# Patient Record
Sex: Male | Born: 1980 | Race: White | Hispanic: No | Smoking: Current every day smoker
Health system: Southern US, Community
[De-identification: ages and names within clinical notes are randomized; demographics above are authoritative.]

## PROBLEM LIST (undated history)

## (undated) DIAGNOSIS — I10 Essential (primary) hypertension: Secondary | ICD-10-CM

## (undated) DIAGNOSIS — H8109 Meniere's disease, unspecified ear: Secondary | ICD-10-CM

## (undated) HISTORY — PX: NO PAST SURGERIES: SHX2092

---

## 2007-12-18 ENCOUNTER — Emergency Department: Payer: Self-pay | Admitting: Emergency Medicine

## 2011-01-09 DIAGNOSIS — G459 Transient cerebral ischemic attack, unspecified: Secondary | ICD-10-CM | POA: Diagnosis present

## 2011-01-09 DIAGNOSIS — I10 Essential (primary) hypertension: Secondary | ICD-10-CM | POA: Diagnosis present

## 2014-06-21 ENCOUNTER — Emergency Department: Payer: Self-pay | Admitting: Emergency Medicine

## 2014-06-21 LAB — CBC WITH DIFFERENTIAL/PLATELET
BASOS PCT: 0.4 %
Basophil #: 0.1 10*3/uL (ref 0.0–0.1)
EOS ABS: 0.1 10*3/uL (ref 0.0–0.7)
Eosinophil %: 0.5 %
HCT: 47.5 % (ref 40.0–52.0)
HGB: 15.6 g/dL (ref 13.0–18.0)
LYMPHS PCT: 9 %
Lymphocyte #: 1.2 10*3/uL (ref 1.0–3.6)
MCH: 29 pg (ref 26.0–34.0)
MCHC: 32.9 g/dL (ref 32.0–36.0)
MCV: 88 fL (ref 80–100)
MONO ABS: 0.4 x10 3/mm (ref 0.2–1.0)
MONOS PCT: 2.7 %
NEUTROS ABS: 12 10*3/uL — AB (ref 1.4–6.5)
Neutrophil %: 87.4 %
Platelet: 208 10*3/uL (ref 150–440)
RBC: 5.39 10*6/uL (ref 4.40–5.90)
RDW: 14.3 % (ref 11.5–14.5)
WBC: 13.8 10*3/uL — AB (ref 3.8–10.6)

## 2014-06-21 LAB — BASIC METABOLIC PANEL
ANION GAP: 9 (ref 7–16)
BUN: 11 mg/dL (ref 7–18)
CHLORIDE: 105 mmol/L (ref 98–107)
Calcium, Total: 8.9 mg/dL (ref 8.5–10.1)
Co2: 27 mmol/L (ref 21–32)
Creatinine: 0.86 mg/dL (ref 0.60–1.30)
EGFR (African American): >60
EGFR (Non-African Amer.): >60
GLUCOSE: 127 mg/dL — AB (ref 65–99)
Osmolality: 282 (ref 275–301)
Potassium: 4.2 mmol/L (ref 3.5–5.1)
SODIUM: 141 mmol/L (ref 136–145)

## 2014-06-21 LAB — TROPONIN I: Troponin-I: <0.02 ng/mL

## 2015-01-16 IMAGING — CT CT HEAD WITHOUT CONTRAST
1 series · 16 of 30 positions shown, 20 images · non-contrast
Comparison: None.

CLINICAL DATA: Several weeks of dizziness with episode of vomiting
today.

EXAM:
CT HEAD WITHOUT CONTRAST
TECHNIQUE: Contiguous axial images were obtained from the base of the skull
through the vertex without intravenous contrast.

[Series 2: head wo · axial · 0.45mm/px · z∈[+382,+514]mm · 16 of 30 slices shown, 20 images]
[im 2/30  brain]
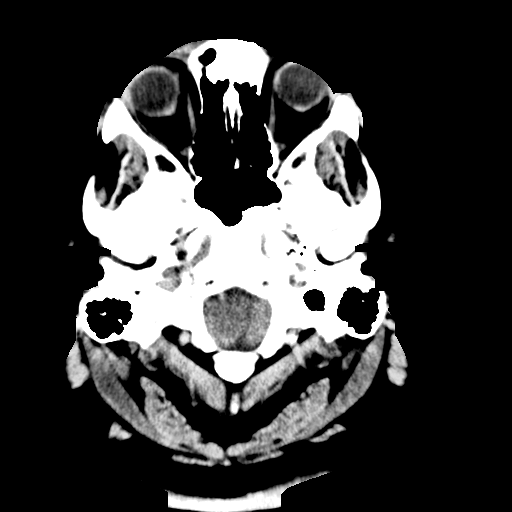
[im 2/30  bone]
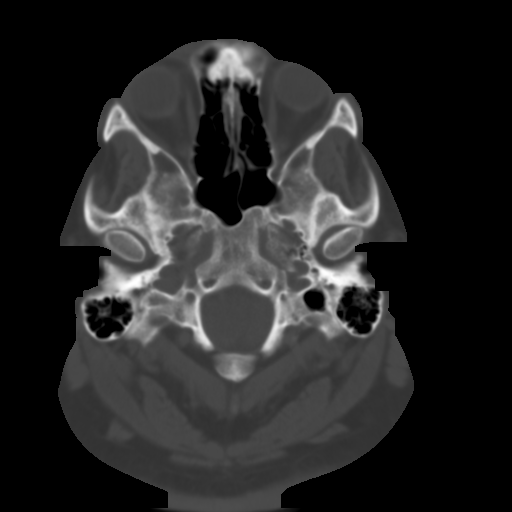
[im 4/30  brain]
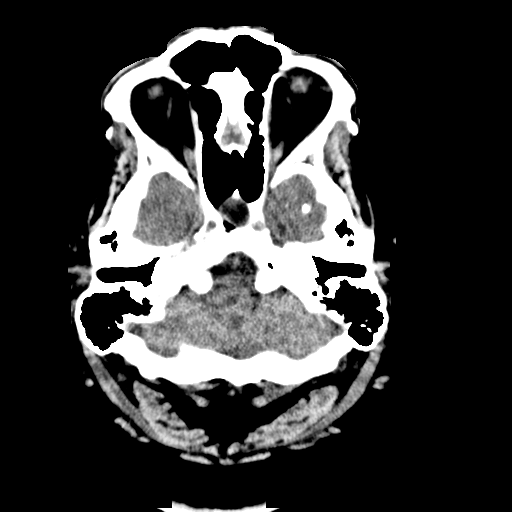
[im 6/30  brain]
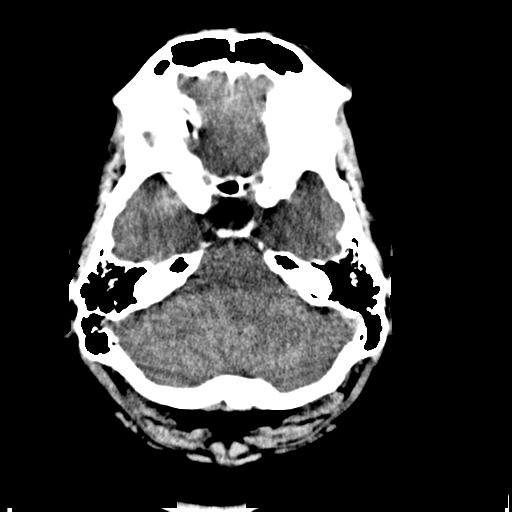
[im 8/30  brain]
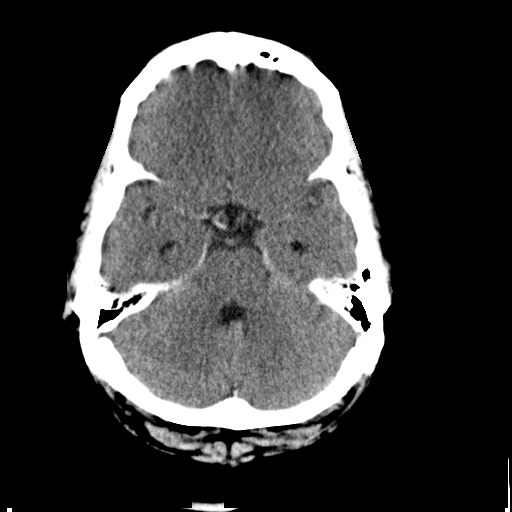
[im 9/30  brain]
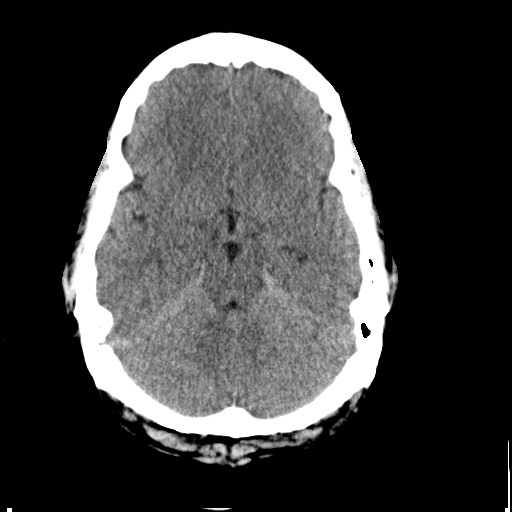
[im 9/30  bone]
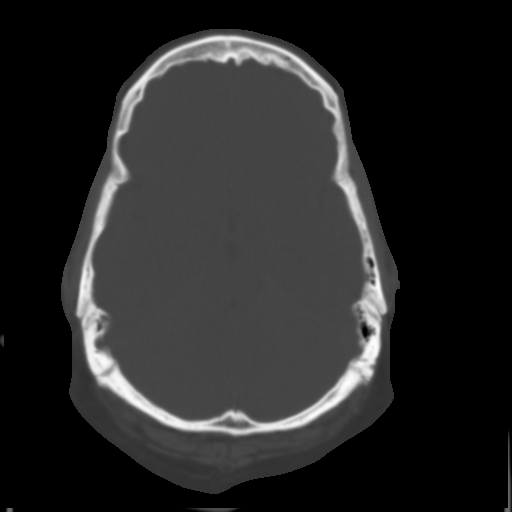
[im 11/30  brain]
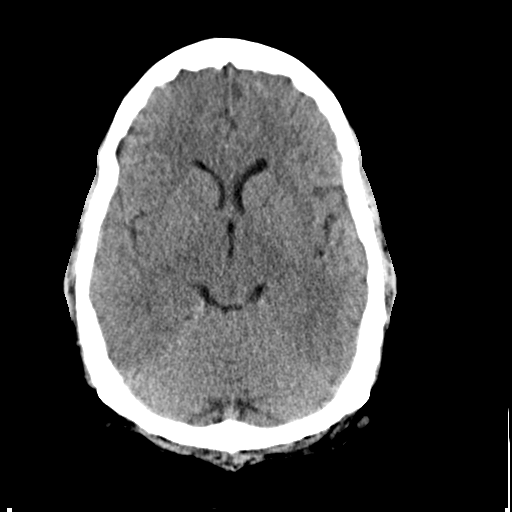
[im 13/30  brain]
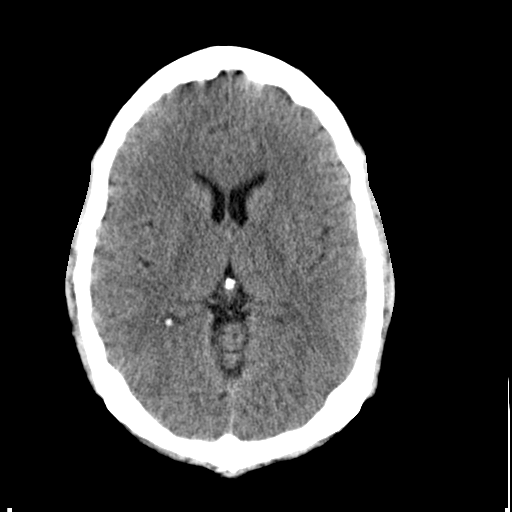
[im 15/30  brain]
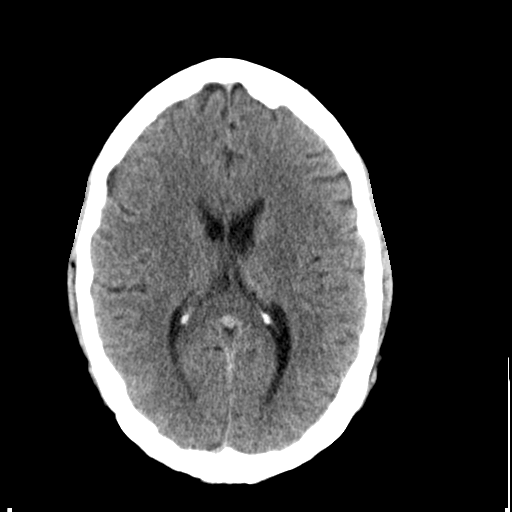
[im 16/30  brain]
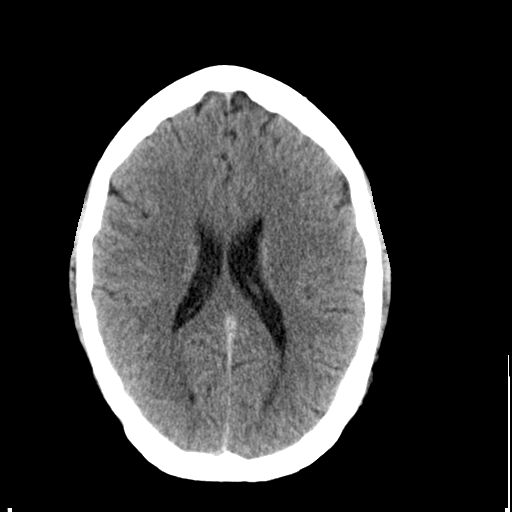
[im 16/30  bone]
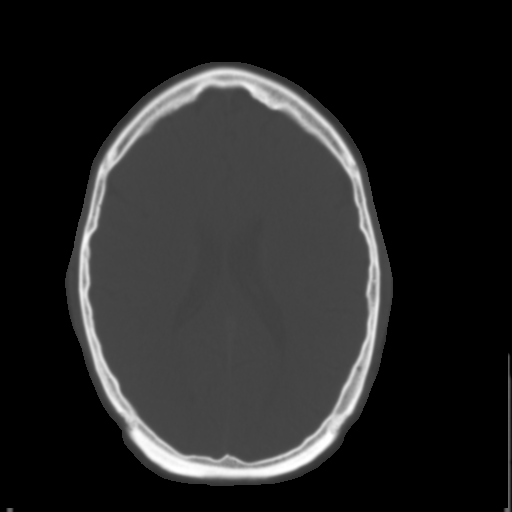
[im 18/30  brain]
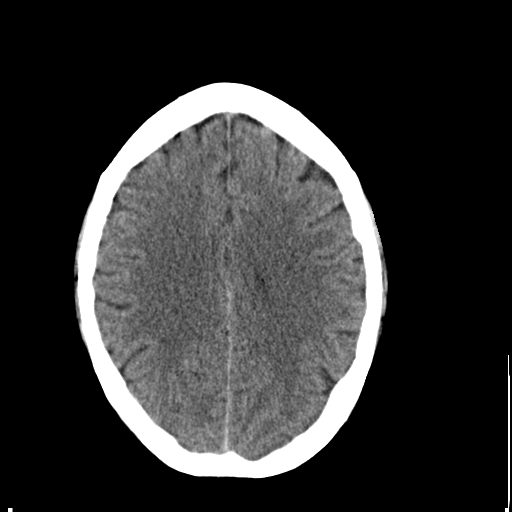
[im 20/30  brain]
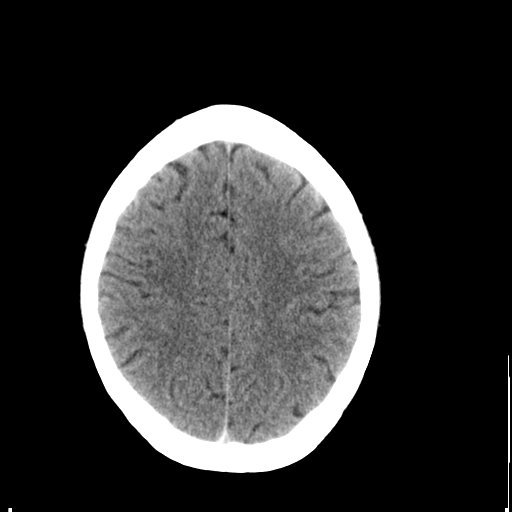
[im 22/30  brain]
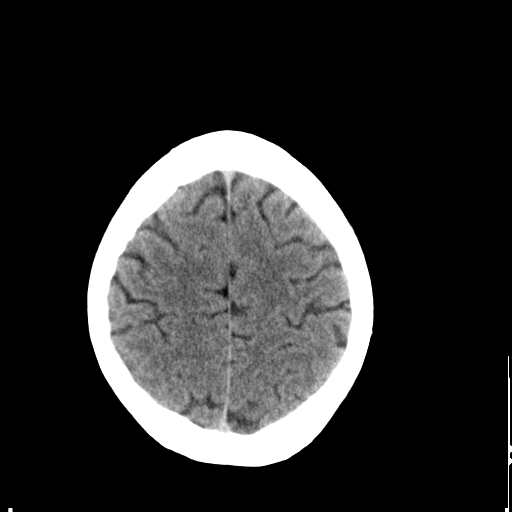
[im 23/30  brain]
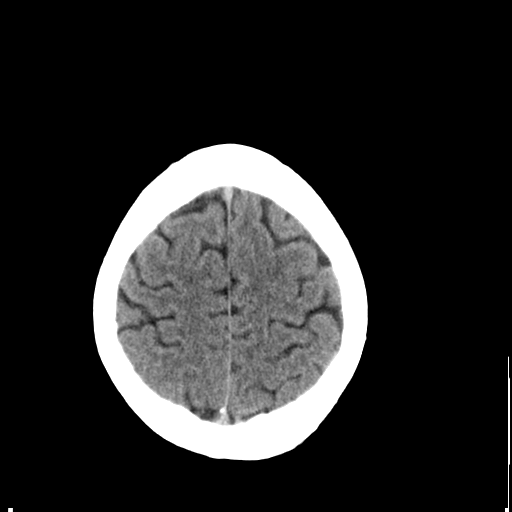
[im 23/30  bone]
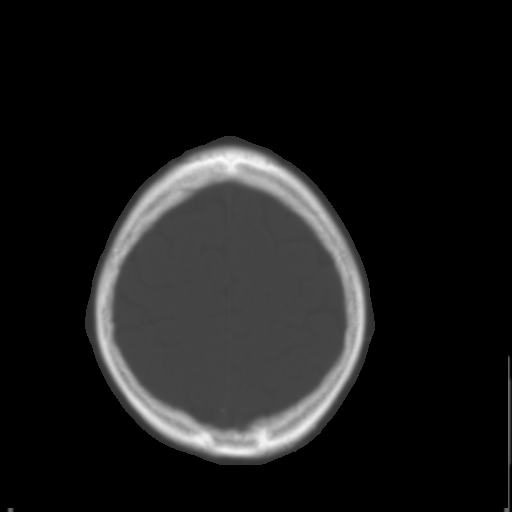
[im 25/30  brain]
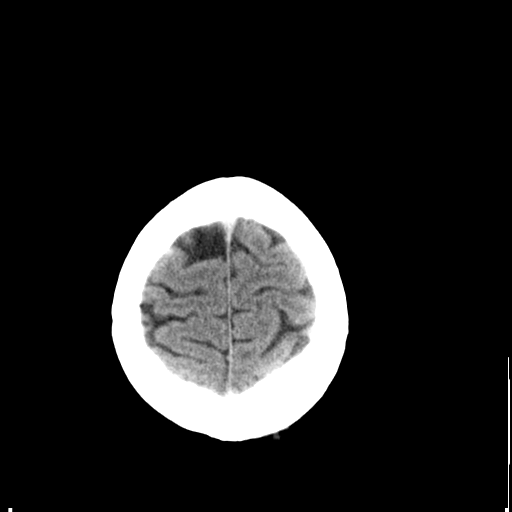
[im 27/30  brain]
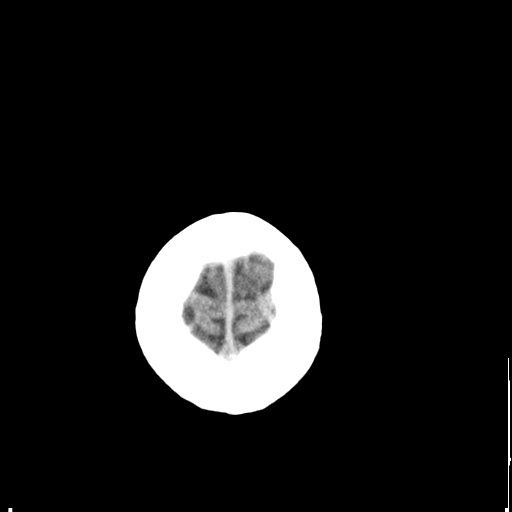
[im 29/30  brain]
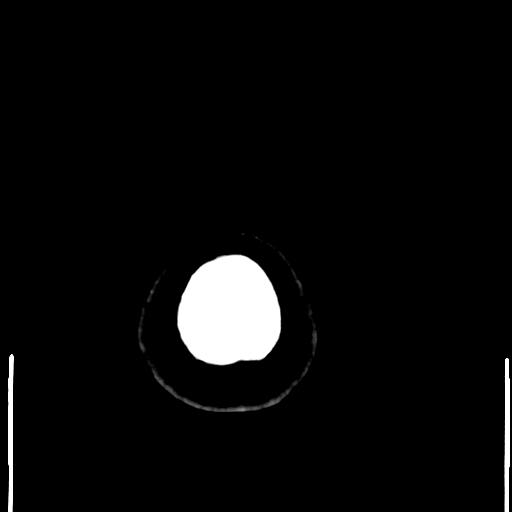

[16 of 30 positions shown; findings below may reference images not displayed]

FINDINGS: Examination demonstrates slight asymmetry of the cerebellar vermis
projecting over the left side of the fourth ventricle. Remaining
ventricles, cisterns and other CSF spaces are within normal. There
is no mass effect, shift of midline structures or acute hemorrhage.
There is no evidence of acute infarction. Remaining bones and soft
tissues are within normal.
IMPRESSION: No acute intracranial findings.

Mild asymmetry of the cerebellar vermis with focal projection over
the left side of the fourth ventricle as cannot exclude a mass in
this location. Recommend MRI of the brain with and without contrast
on an elective basis for further evaluation.

## 2018-08-20 ENCOUNTER — Emergency Department: Payer: PRIVATE HEALTH INSURANCE

## 2018-08-20 ENCOUNTER — Emergency Department
Admission: EM | Admit: 2018-08-20 | Discharge: 2018-08-20 | Disposition: A | Discharge status: Home / Self Care | Payer: PRIVATE HEALTH INSURANCE | Attending: Emergency Medicine | Admitting: Emergency Medicine

## 2018-08-20 ENCOUNTER — Encounter: Payer: Self-pay | Admitting: Emergency Medicine

## 2018-08-20 DIAGNOSIS — I10 Essential (primary) hypertension: Secondary | ICD-10-CM | POA: Insufficient documentation

## 2018-08-20 DIAGNOSIS — L03031 Cellulitis of right toe: Secondary | ICD-10-CM | POA: Insufficient documentation

## 2018-08-20 DIAGNOSIS — M7989 Other specified soft tissue disorders: Secondary | ICD-10-CM | POA: Diagnosis present

## 2018-08-20 DIAGNOSIS — Z7982 Long term (current) use of aspirin: Secondary | ICD-10-CM | POA: Diagnosis not present

## 2018-08-20 DIAGNOSIS — F17210 Nicotine dependence, cigarettes, uncomplicated: Secondary | ICD-10-CM | POA: Insufficient documentation

## 2018-08-20 DIAGNOSIS — L97512 Non-pressure chronic ulcer of other part of right foot with fat layer exposed: Secondary | ICD-10-CM | POA: Insufficient documentation

## 2018-08-20 LAB — COMPREHENSIVE METABOLIC PANEL
ALK PHOS: 118 U/L (ref 38–126)
ALT: 19 U/L (ref 0–44)
AST: 21 U/L (ref 15–41)
Albumin: 4.3 g/dL (ref 3.5–5.0)
Anion gap: 10 (ref 5–15)
BILIRUBIN TOTAL: 0.7 mg/dL (ref 0.3–1.2)
BUN: 11 mg/dL (ref 6–20)
CALCIUM: 9.7 mg/dL (ref 8.9–10.3)
CO2: 26 mmol/L (ref 22–32)
Chloride: 102 mmol/L (ref 98–111)
Creatinine, Ser: 0.83 mg/dL (ref 0.61–1.24)
GFR calc Af Amer: >60 mL/min (ref >60)
GFR calc non Af Amer: >60 mL/min (ref >60)
Glucose, Bld: 175 mg/dL — ABNORMAL HIGH (ref 70–99)
Potassium: 3.3 mmol/L — ABNORMAL LOW (ref 3.5–5.1)
Sodium: 138 mmol/L (ref 135–145)
TOTAL PROTEIN: 7.6 g/dL (ref 6.5–8.1)

## 2018-08-20 LAB — CBC WITH DIFFERENTIAL/PLATELET
Abs Immature Granulocytes: 0.04 10*3/uL (ref 0.00–0.07)
Basophils Absolute: 0 10*3/uL (ref 0.0–0.1)
Basophils Relative: 1 %
EOS PCT: 3 %
Eosinophils Absolute: 0.3 10*3/uL (ref 0.0–0.5)
HEMATOCRIT: 49.4 % (ref 39.0–52.0)
Hemoglobin: 16 g/dL (ref 13.0–17.0)
IMMATURE GRANULOCYTES: 1 %
LYMPHS ABS: 2.2 10*3/uL (ref 0.7–4.0)
Lymphocytes Relative: 25 %
MCH: 28.7 pg (ref 26.0–34.0)
MCHC: 32.4 g/dL (ref 30.0–36.0)
MCV: 88.5 fL (ref 80.0–100.0)
MONOS PCT: 5 %
Monocytes Absolute: 0.4 10*3/uL (ref 0.1–1.0)
Neutro Abs: 5.8 10*3/uL (ref 1.7–7.7)
Neutrophils Relative %: 65 %
Platelets: 282 10*3/uL (ref 150–400)
RBC: 5.58 MIL/uL (ref 4.22–5.81)
RDW: 13.1 % (ref 11.5–15.5)
WBC: 8.8 10*3/uL (ref 4.0–10.5)
nRBC: 0 % (ref 0.0–0.2)

## 2018-08-20 MED ORDER — CEPHALEXIN 500 MG PO CAPS: 500.0000 mg | ORAL_CAPSULE | Freq: Three times a day (TID) | ORAL | 0 refills | Status: AC

## 2018-08-20 MED ORDER — CEPHALEXIN 500 MG PO CAPS
500.0000 mg | ORAL_CAPSULE | Freq: Once | ORAL | Status: AC
  Administered 2018-08-20: 500 mg via ORAL
  Filled 2018-08-20: qty 1

## 2018-08-20 MED ORDER — CLINDAMYCIN HCL 300 MG PO CAPS: 300.0000 mg | ORAL_CAPSULE | Freq: Three times a day (TID) | ORAL | 0 refills | Status: AC

## 2018-08-20 MED ORDER — CLINDAMYCIN PHOSPHATE 600 MG/4ML IJ SOLN
600.0000 mg | Freq: Once | INTRAMUSCULAR | Status: AC
  Administered 2018-08-20: 600 mg via INTRAMUSCULAR
  Filled 2018-08-20: qty 4

## 2018-08-20 NOTE — ED Triage Notes (Signed)
PT arrives with significant other with concerns over infection to right big toe. Pt thought it was a plantar wart and applied callus remove. Pt states when he removed the callus removed an area of his big toe was also removed. Small circular area missing from pt's great toe. Toe red and swollen. Pt states the area started over one month ago.

## 2018-08-20 NOTE — Discharge Instructions (Signed)

## 2018-08-20 NOTE — ED Provider Notes (Signed)
[facility] Emergency Department Provider Note  ____________________________________________  Time seen: Approximately 5:12 PM  I have reviewed the triage vital signs and the nursing notes.   HISTORY  Chief Complaint Wound Infection   HPI Jacob Love is a 37 y.o. male who presents for evaluation of right big toe pain and swelling.  Patient reports that he noticed a callus on his right big toe a month ago.  He used a callus remover and left it in place for 2 days.  When he removed it he noticed a piece of the skin came off with it.  He has been treating at home with topical antibiotics, alcohol, and Band-Aid.  Since yesterday he started noticed significant swelling and redness of the toe.  No fever or chills.  His pain is mild but constant, throbbing, worse with weightbearing.  PMH Chiari I malformation Migraine Mnire's disease Hypertension  Prior to Admission medications   Medication Sig Start Date End Date Taking? Authorizing Provider  aspirin EC 81 MG tablet Take 81 mg by mouth daily. 01/09/11  Yes [provider]  hydrochlorothiazide (HYDRODIURIL) 25 MG tablet Take 25 mg by mouth daily. 08/10/18  Yes [provider]  cephALEXin (KEFLEX) 500 MG capsule Take 1 capsule (500 mg total) by mouth 3 (three) times daily for 10 days. 08/20/18 08/30/18  Nita Sickle, MD  clindamycin (CLEOCIN) 300 MG capsule Take 1 capsule (300 mg total) by mouth 3 (three) times daily for 10 days. 08/20/18 08/30/18  Nita Sickle, MD    Allergies Patient has no known allergies.  No family history on file.  Social History Social History   Tobacco Use  . Smoking status: Current Every Day Smoker  . Smokeless tobacco: Current User  Substance Use Topics  . Alcohol use: Yes  . Drug use: Never    Review of Systems  Constitutional: Negative for fever. Eyes: Negative for visual changes. ENT: Negative for sore throat. Neck: No neck pain    Cardiovascular: Negative for chest pain. Respiratory: Negative for shortness of breath. Gastrointestinal: Negative for abdominal pain, vomiting or diarrhea. Genitourinary: Negative for dysuria. Musculoskeletal: Negative for back pain. + R big toe pain and swelling Skin: Negative for rash. Neurological: Negative for headaches, weakness or numbness. Psych: No SI or HI  ____________________________________________   PHYSICAL EXAM:  VITAL SIGNS: ED Triage Vitals  Enc Vitals Group     BP 08/20/18 1525 105/77     Pulse Rate 08/20/18 1525 90     Resp 08/20/18 1525 20     Temp 08/20/18 1525 99.5 F (37.5 C)     Temp Source 08/20/18 1525 Oral     SpO2 08/20/18 1525 97 %     Weight 08/20/18 1526 (!) 410 lb (186 kg)     Height 08/20/18 1526 6\' 3"  (1.905 m)     Head Circumference --      Peak Flow --      Pain Score 08/20/18 1534 1     Pain Loc --      Pain Edu? --      Excl. in [place]? --     Constitutional: Alert and oriented. Well appearing and in no apparent distress. HEENT:      Head: Normocephalic and atraumatic.         Eyes: Conjunctivae are normal. Sclera is non-icteric.       Mouth/Throat: Mucous membranes are moist.       Neck: Supple with no signs of meningismus. Cardiovascular:  Regular rate and rhythm. No murmurs, gallops, or rubs. 2+ symmetrical distal pulses are present in all extremities. No JVD. Respiratory: Normal respiratory effort. Lungs are clear to auscultation bilaterally. No wheezes, crackles, or rhonchi.  Gastrointestinal: Soft, non tender, and non distended with positive bowel sounds. No rebound or guarding. Musculoskeletal: There is a circular ulceration located on the bottom of the right big toe with surrounding erythema, warmth, and swelling.  There is no significant pain with joint movement.  The redness is streaking up towards the sole of the foot. No bone exposure neurologic: Normal speech and language. Face is symmetric. Moving all extremities. No gross  focal neurologic deficits are appreciated. Skin: Skin is warm, dry and intact. No rash noted. Psychiatric: Mood and affect are normal. Speech and behavior are normal.  ____________________________________________   LABS (all labs ordered are listed, but only abnormal results are displayed)  Labs Reviewed  COMPREHENSIVE METABOLIC PANEL - Abnormal; Notable for the following components:      Result Value   Potassium 3.3 (*)    Glucose, Bld 175 (*)    All other components within normal limits  CBC WITH DIFFERENTIAL/PLATELET   ____________________________________________  EKG  none  ____________________________________________  RADIOLOGY  I have personally reviewed the images performed during this visit and I agree with the Radiologist's read.   Interpretation by Radiologist:  Dg Foot Complete Right  Result Date: 08/20/2018 CLINICAL DATA:  Infection. Patient states that he thought that he had a wart on his right great toe and used callus remover. Patient states when he removed the callus it removed an area of his great toe as well. Soft tissue defect of the RIGHT great toe. Toe is red and swollen. Symptoms 1 month ago. EXAM: RIGHT FOOT COMPLETE - 3+ VIEW COMPARISON:  None. FINDINGS: There is marked soft tissue swelling of the great toe. Focal soft tissue defect is identified along the MEDIAL aspect of the interphalangeal joint of the great toe, consistent with abscess/wound. Otherwise no additional soft tissue gas identified. There is no acute fracture. Note is made of hallux valgus deformity. IMPRESSION: Marked soft tissue swelling and soft tissue defect of the great toe. No acute osseous abnormality. Electronically Signed   By: Norva Pavlov M.D.   On: 08/20/2018 17:05     ____________________________________________   PROCEDURES  Procedure(s) performed: None Procedures Critical Care performed:  None ____________________________________________   INITIAL IMPRESSION /  ASSESSMENT AND PLAN / ED COURSE   37 y.o. male who presents for evaluation of right big toe pain and swelling.  Patient presenting with right big toe open wound and surrounding cellulitis.  No crepitus.  X-ray with no evidence of free air.  Full painless range of motion of the joint with no fever and no leukocytosis, no clinical signs of a septic joint.  Patient is not diabetic and has strong pulses on his lower extremities.  At this time will treat with Clinda and Keflex and close follow-up with primary care doctor. Labs showing glucose of 175 although not fasting is slightly elevated making me concern for may be mild diabetes/prediabetes especially since patient is also overweight.  Will need very close monitoring of the wound and close f/u with PCP/ podiatry and wound care. Dicussed very strict return precautions with patient.       As part of my medical decision making, I reviewed the following data within the electronic MEDICAL RECORD NUMBER Nursing notes reviewed and incorporated, Labs reviewed , Radiograph reviewed , Notes from prior  ED visits and [place] Controlled Substance Database    Pertinent labs & imaging results that were available during my care of the patient were reviewed by me and considered in my medical decision making (see chart for details).    ____________________________________________   FINAL CLINICAL IMPRESSION(S) / ED DIAGNOSES  Final diagnoses:  Skin ulcer of toe of right foot with fat layer exposed (HCC)  Cellulitis of toe of right foot      NEW MEDICATIONS STARTED DURING THIS VISIT:  ED Discharge Orders         Ordered    clindamycin (CLEOCIN) 300 MG capsule  3 times daily     08/20/18 1720    cephALEXin (KEFLEX) 500 MG capsule  3 times daily     08/20/18 1720           Note:  This document was prepared using Dragon voice recognition software and may include unintentional dictation errors.    Nita Sickle, MD 08/20/18 [phone]

## 2019-03-17 IMAGING — DX DG FOOT COMPLETE 3+V*R*
3 series · 3 of 3 positions shown · non-contrast
Comparison: None.

CLINICAL DATA: Infection. Patient states that he thought that he
had a wart on his right great toe and used callus remover. Patient
states when he removed the callus it removed an area of his great
toe as well. Soft tissue defect of the RIGHT great toe. Toe is red
and swollen. Symptoms 1 month ago.

EXAM:
RIGHT FOOT COMPLETE - 3+ VIEW

[foot ap]
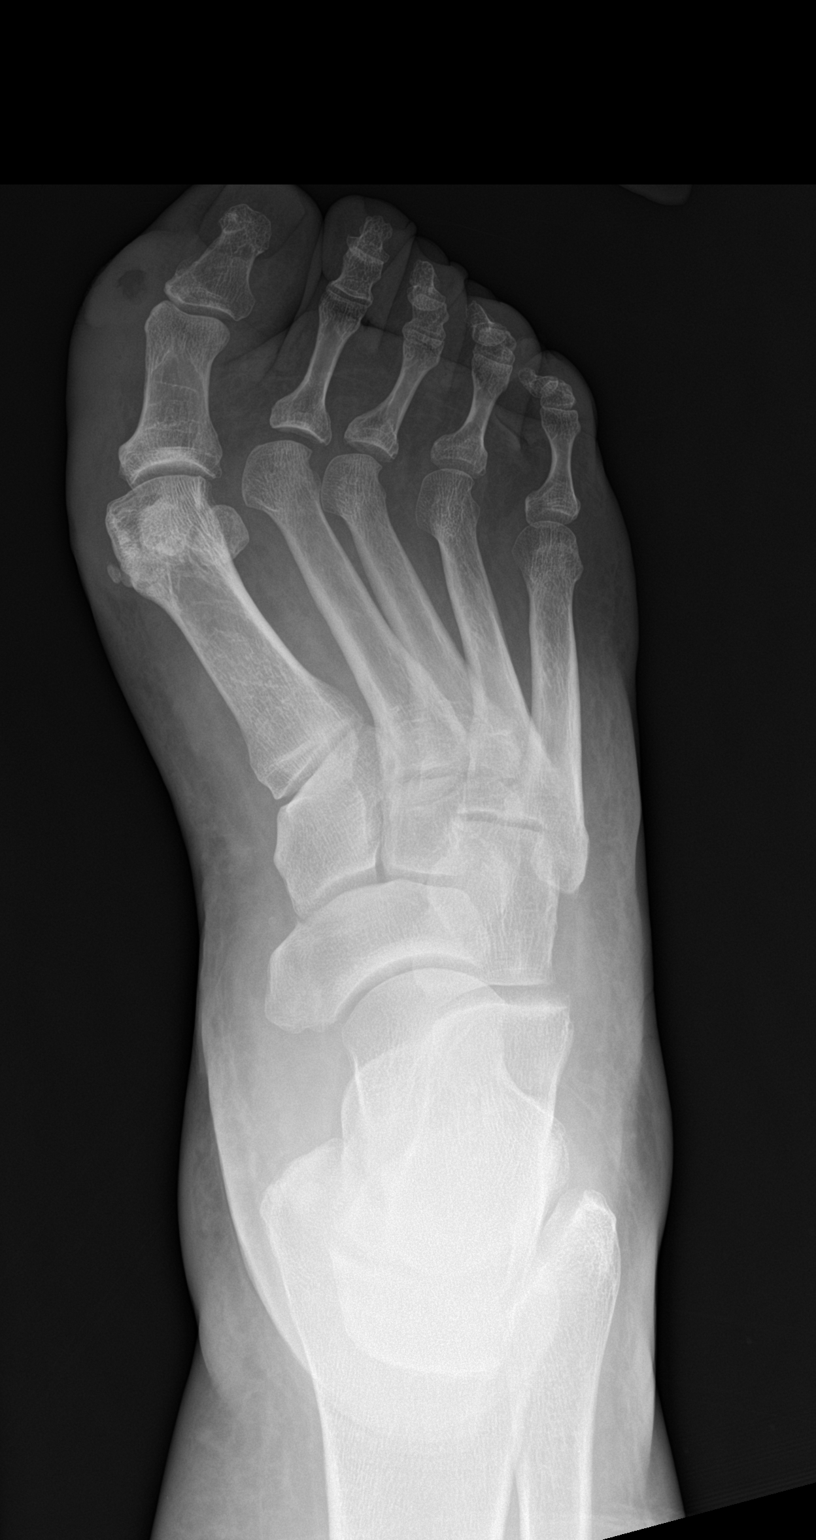

[foot obl]
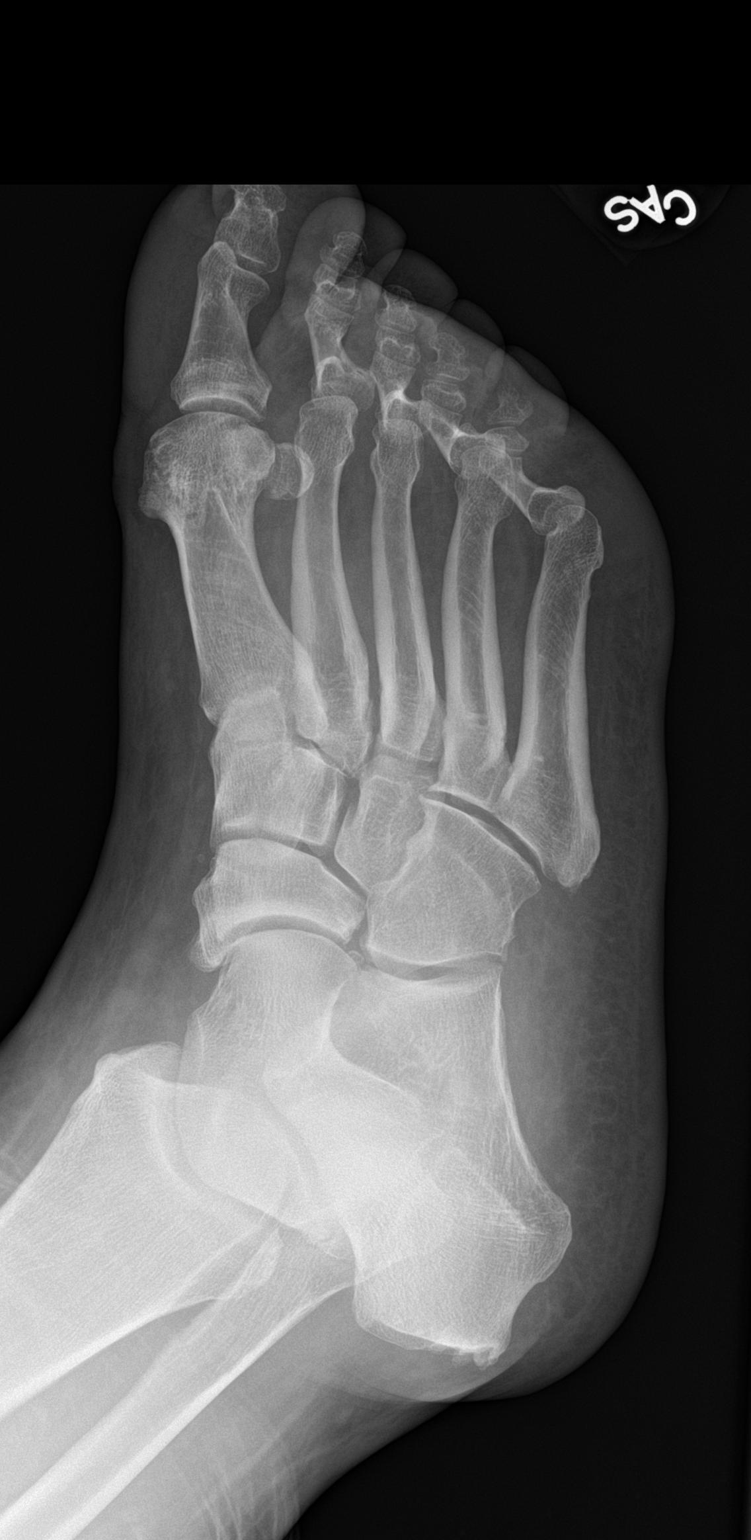

[foot lat]
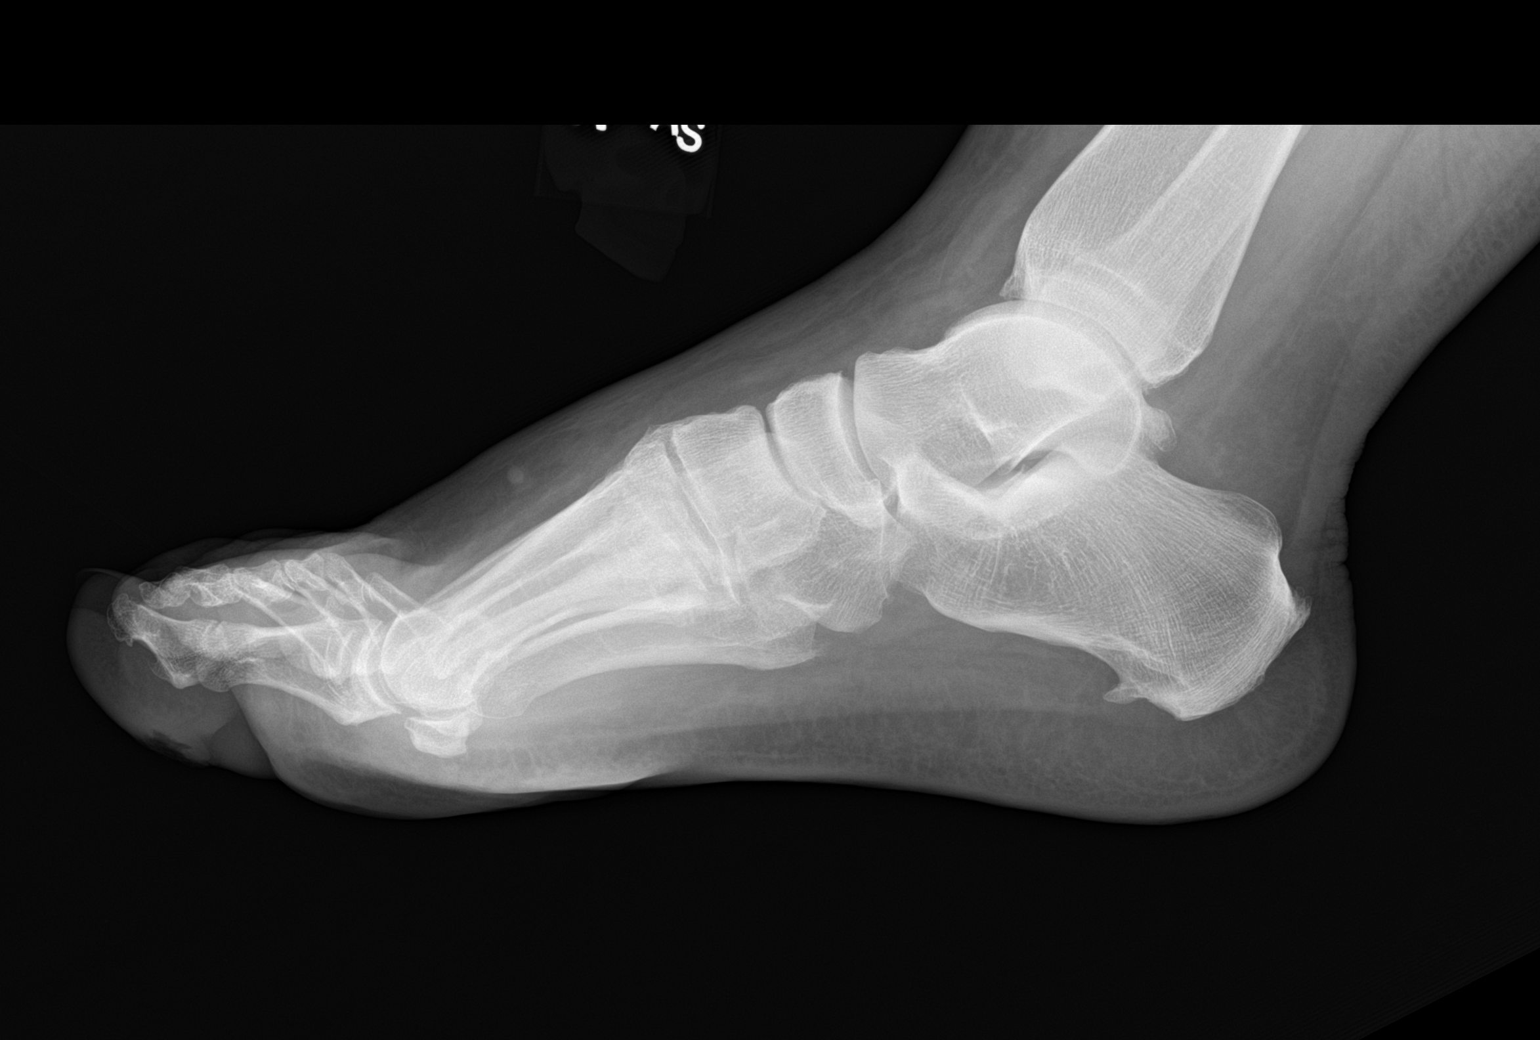

[3 of 3 positions shown; findings below may reference images not displayed]

FINDINGS: There is marked soft tissue swelling of the great toe. Focal soft
tissue defect is identified along the MEDIAL aspect of the
interphalangeal joint of the great toe, consistent with
abscess/wound. Otherwise no additional soft tissue gas identified.
There is no acute fracture. Note is made of hallux valgus deformity.
IMPRESSION: Marked soft tissue swelling and soft tissue defect of the great toe.
No acute osseous abnormality.

## 2019-05-28 DIAGNOSIS — F17200 Nicotine dependence, unspecified, uncomplicated: Secondary | ICD-10-CM | POA: Diagnosis present

## 2022-08-24 ENCOUNTER — Inpatient Hospital Stay
Admission: EM | Admit: 2022-08-24 | Discharge: 2022-08-27 | DRG: 854 | Disposition: A | Discharge status: Home / Self Care | Payer: Medicaid Other | Attending: Internal Medicine | Admitting: Internal Medicine

## 2022-08-24 ENCOUNTER — Encounter: Payer: Self-pay | Admitting: Internal Medicine

## 2022-08-24 ENCOUNTER — Emergency Department: Payer: Medicaid Other

## 2022-08-24 DIAGNOSIS — L97519 Non-pressure chronic ulcer of other part of right foot with unspecified severity: Secondary | ICD-10-CM | POA: Diagnosis present

## 2022-08-24 DIAGNOSIS — I96 Gangrene, not elsewhere classified: Secondary | ICD-10-CM | POA: Diagnosis present

## 2022-08-24 DIAGNOSIS — E876 Hypokalemia: Secondary | ICD-10-CM | POA: Diagnosis present

## 2022-08-24 DIAGNOSIS — Z79899 Other long term (current) drug therapy: Secondary | ICD-10-CM

## 2022-08-24 DIAGNOSIS — M869 Osteomyelitis, unspecified: Secondary | ICD-10-CM | POA: Diagnosis present

## 2022-08-24 DIAGNOSIS — G629 Polyneuropathy, unspecified: Secondary | ICD-10-CM | POA: Diagnosis present

## 2022-08-24 DIAGNOSIS — J9811 Atelectasis: Secondary | ICD-10-CM | POA: Diagnosis present

## 2022-08-24 DIAGNOSIS — G459 Transient cerebral ischemic attack, unspecified: Secondary | ICD-10-CM | POA: Diagnosis present

## 2022-08-24 DIAGNOSIS — I10 Essential (primary) hypertension: Secondary | ICD-10-CM | POA: Diagnosis present

## 2022-08-24 DIAGNOSIS — E872 Acidosis, unspecified: Secondary | ICD-10-CM | POA: Diagnosis present

## 2022-08-24 DIAGNOSIS — Z23 Encounter for immunization: Secondary | ICD-10-CM | POA: Diagnosis not present

## 2022-08-24 DIAGNOSIS — R6520 Severe sepsis without septic shock: Secondary | ICD-10-CM | POA: Diagnosis present

## 2022-08-24 DIAGNOSIS — Z8673 Personal history of transient ischemic attack (TIA), and cerebral infarction without residual deficits: Secondary | ICD-10-CM | POA: Diagnosis not present

## 2022-08-24 DIAGNOSIS — F17210 Nicotine dependence, cigarettes, uncomplicated: Secondary | ICD-10-CM | POA: Diagnosis present

## 2022-08-24 DIAGNOSIS — E871 Hypo-osmolality and hyponatremia: Secondary | ICD-10-CM | POA: Diagnosis present

## 2022-08-24 DIAGNOSIS — Z6841 Body Mass Index (BMI) 40.0 and over, adult: Secondary | ICD-10-CM | POA: Diagnosis not present

## 2022-08-24 DIAGNOSIS — E861 Hypovolemia: Secondary | ICD-10-CM | POA: Diagnosis present

## 2022-08-24 DIAGNOSIS — A419 Sepsis, unspecified organism: Secondary | ICD-10-CM | POA: Diagnosis present

## 2022-08-24 DIAGNOSIS — R739 Hyperglycemia, unspecified: Secondary | ICD-10-CM | POA: Diagnosis present

## 2022-08-24 DIAGNOSIS — L97512 Non-pressure chronic ulcer of other part of right foot with fat layer exposed: Secondary | ICD-10-CM

## 2022-08-24 DIAGNOSIS — L03115 Cellulitis of right lower limb: Secondary | ICD-10-CM | POA: Diagnosis present

## 2022-08-24 DIAGNOSIS — F17200 Nicotine dependence, unspecified, uncomplicated: Secondary | ICD-10-CM | POA: Diagnosis present

## 2022-08-24 HISTORY — DX: Morbid (severe) obesity due to excess calories: E66.01

## 2022-08-24 HISTORY — DX: Essential (primary) hypertension: I10

## 2022-08-24 HISTORY — DX: Meniere's disease, unspecified ear: H81.09

## 2022-08-24 LAB — CBC WITH DIFFERENTIAL/PLATELET
Abs Immature Granulocytes: 0.16 10*3/uL — ABNORMAL HIGH (ref 0.00–0.07)
Basophils Absolute: 0.1 10*3/uL (ref 0.0–0.1)
Basophils Relative: 0 %
Eosinophils Absolute: 0 10*3/uL (ref 0.0–0.5)
Eosinophils Relative: 0 %
HCT: 41.8 % (ref 39.0–52.0)
Hemoglobin: 13.8 g/dL (ref 13.0–17.0)
Immature Granulocytes: 1 %
Lymphocytes Relative: 8 %
Lymphs Abs: 1.7 10*3/uL (ref 0.7–4.0)
MCH: 27.5 pg (ref 26.0–34.0)
MCHC: 33 g/dL (ref 30.0–36.0)
MCV: 83.3 fL (ref 80.0–100.0)
Monocytes Absolute: 1.6 10*3/uL — ABNORMAL HIGH (ref 0.1–1.0)
Monocytes Relative: 8 %
Neutro Abs: 17.2 10*3/uL — ABNORMAL HIGH (ref 1.7–7.7)
Neutrophils Relative %: 83 %
Platelets: 482 10*3/uL — ABNORMAL HIGH (ref 150–400)
RBC: 5.02 MIL/uL (ref 4.22–5.81)
RDW: 13.7 % (ref 11.5–15.5)
WBC: 20.8 10*3/uL — ABNORMAL HIGH (ref 4.0–10.5)
nRBC: 0 % (ref 0.0–0.2)

## 2022-08-24 LAB — URINALYSIS, ROUTINE W REFLEX MICROSCOPIC
Bilirubin Urine: NEGATIVE
Glucose, UA: NEGATIVE mg/dL
Hgb urine dipstick: NEGATIVE
Ketones, ur: NEGATIVE mg/dL
Leukocytes,Ua: NEGATIVE
Nitrite: NEGATIVE
Protein, ur: NEGATIVE mg/dL
Specific Gravity, Urine: 1.01 (ref 1.005–1.030)
pH: 6 (ref 5.0–8.0)

## 2022-08-24 LAB — COMPREHENSIVE METABOLIC PANEL
ALT: 19 U/L (ref 0–44)
AST: 33 U/L (ref 15–41)
Albumin: 3.4 g/dL — ABNORMAL LOW (ref 3.5–5.0)
Alkaline Phosphatase: 101 U/L (ref 38–126)
Anion gap: 12 (ref 5–15)
BUN: 10 mg/dL (ref 6–20)
CO2: 27 mmol/L (ref 22–32)
Calcium: 9.1 mg/dL (ref 8.9–10.3)
Chloride: 91 mmol/L — ABNORMAL LOW (ref 98–111)
Creatinine, Ser: 1.05 mg/dL (ref 0.61–1.24)
GFR, Estimated: >60 mL/min (ref >60)
Glucose, Bld: 129 mg/dL — ABNORMAL HIGH (ref 70–99)
Potassium: 2.7 mmol/L — CL (ref 3.5–5.1)
Sodium: 130 mmol/L — ABNORMAL LOW (ref 135–145)
Total Bilirubin: 0.9 mg/dL (ref 0.3–1.2)
Total Protein: 8.7 g/dL — ABNORMAL HIGH (ref 6.5–8.1)

## 2022-08-24 LAB — PROTIME-INR
INR: 1.3 — ABNORMAL HIGH (ref 0.8–1.2)
Prothrombin Time: 15.6 seconds — ABNORMAL HIGH (ref 11.4–15.2)

## 2022-08-24 LAB — LACTIC ACID, PLASMA
Lactic Acid, Venous: 2.7 mmol/L (ref 0.5–1.9)
Lactic Acid, Venous: 1.3 mmol/L (ref 0.5–1.9)

## 2022-08-24 LAB — MAGNESIUM: Magnesium: 1.9 mg/dL (ref 1.7–2.4)

## 2022-08-24 MED ORDER — LACTATED RINGERS IV SOLN: INTRAVENOUS | Status: DC

## 2022-08-24 MED ORDER — SODIUM CHLORIDE 0.9% FLUSH
3.0000 mL | Freq: Two times a day (BID) | INTRAVENOUS | Status: DC
  Administered 2022-08-25 – 2022-08-27 (×6): 3 mL via INTRAVENOUS

## 2022-08-24 MED ORDER — ACETAMINOPHEN 500 MG PO TABS
1000.0000 mg | ORAL_TABLET | Freq: Once | ORAL | Status: AC
  Administered 2022-08-24: 1000 mg via ORAL
  Filled 2022-08-24: qty 2

## 2022-08-24 MED ORDER — ASPIRIN 81 MG PO TBEC
81.0000 mg | DELAYED_RELEASE_TABLET | Freq: Every day | ORAL | Status: DC
  Administered 2022-08-25: 81 mg via ORAL
  Filled 2022-08-24 (×2): qty 1

## 2022-08-24 MED ORDER — HYDROCHLOROTHIAZIDE 25 MG PO TABS
25.0000 mg | ORAL_TABLET | Freq: Every day | ORAL | Status: DC
  Administered 2022-08-25 – 2022-08-27 (×2): 25 mg via ORAL
  Filled 2022-08-24 (×3): qty 1

## 2022-08-24 MED ORDER — PIPERACILLIN-TAZOBACTAM 3.375 G IVPB 30 MIN
3.3750 g | Freq: Once | INTRAVENOUS | Status: AC
  Administered 2022-08-24: 3.375 g via INTRAVENOUS
  Filled 2022-08-24: qty 50

## 2022-08-24 MED ORDER — VANCOMYCIN HCL IN DEXTROSE 1-5 GM/200ML-% IV SOLN
1000.0000 mg | Freq: Once | INTRAVENOUS | Status: AC
  Administered 2022-08-24: 1000 mg via INTRAVENOUS
  Filled 2022-08-24: qty 200

## 2022-08-24 MED ORDER — POTASSIUM CHLORIDE 10 MEQ/100ML IV SOLN
10.0000 meq | INTRAVENOUS | Status: AC
  Administered 2022-08-24 (×2): 10 meq via INTRAVENOUS
  Filled 2022-08-24 (×2): qty 100

## 2022-08-24 MED ORDER — PIPERACILLIN-TAZOBACTAM 3.375 G IVPB 30 MIN: 3.3750 g | Freq: Four times a day (QID) | INTRAVENOUS | Status: DC

## 2022-08-24 MED ORDER — SODIUM CHLORIDE 0.9 % IV BOLUS
2000.0000 mL | Freq: Once | INTRAVENOUS | Status: AC
  Administered 2022-08-24: 2000 mL via INTRAVENOUS

## 2022-08-24 MED ORDER — HYDRALAZINE HCL 20 MG/ML IJ SOLN: 10.0000 mg | Freq: Four times a day (QID) | INTRAMUSCULAR | Status: DC | PRN

## 2022-08-24 MED ORDER — HEPARIN SODIUM (PORCINE) 5000 UNIT/ML IJ SOLN
5000.0000 [IU] | Freq: Three times a day (TID) | INTRAMUSCULAR | Status: DC
  Administered 2022-08-25 – 2022-08-27 (×6): 5000 [IU] via SUBCUTANEOUS
  Filled 2022-08-24 (×7): qty 1

## 2022-08-24 MED ORDER — PIPERACILLIN-TAZOBACTAM 3.375 G IVPB
3.3750 g | Freq: Three times a day (TID) | INTRAVENOUS | Status: DC
  Administered 2022-08-25 – 2022-08-27 (×7): 3.375 g via INTRAVENOUS
  Filled 2022-08-24 (×7): qty 50

## 2022-08-24 MED ORDER — POTASSIUM CHLORIDE CRYS ER 20 MEQ PO TBCR
40.0000 meq | EXTENDED_RELEASE_TABLET | Freq: Once | ORAL | Status: AC
  Administered 2022-08-24: 40 meq via ORAL
  Filled 2022-08-24: qty 2

## 2022-08-24 NOTE — ED Provider Triage Note (Signed)
Emergency Medicine Provider Triage Evaluation Note  Jacob Love , a 41 y.o. male  was evaluated in triage.  Pt complains of foot infection. Patient had a callous he cut off. 4-5 worsening pain, erythema, edema. Now has a "hole" in his foot draining pus. Fever present. Meets septic criteria.  Review of Systems  Positive: Foot infection Negative: Leg pain, CP  Physical Exam  BP (!) 143/80 (BP Location: Left Arm)   Pulse (!) 108   Temp (!) 103 F (39.4 C) (Oral)   Resp 18   Wt (!) 163.3 kg   SpO2 (!) 88%   BMI 45.00 kg/m  Gen:   Awake, no distress   Resp:  Normal effort  MSK:   Moves extremities without difficulty. Significant foot wound to R foot. See pics Other:         Medical Decision Making  Medically screening exam initiated at 6:43 PM.  Appropriate orders placed.  Jacob Love was informed that the remainder of the evaluation will be completed by another provider, this initial triage assessment does not replace that evaluation, and the importance of remaining in the ED until their evaluation is complete.  Foot infection meeting sepsis criteria. Labs, xray, cultures, lactic, fluids and abx   Darletta Moll, PA-C 08/24/22 1843

## 2022-08-24 NOTE — ED Triage Notes (Signed)
PT sts that he had a wound on the right foot big toe for the last couple of months. Pt advised that it has not gotten that bad until today. Pt foot is bleeding through his right shoe.

## 2022-08-24 NOTE — ED Provider Notes (Signed)
[facility] Provider Note    Event Date/Time   First MD Initiated Contact with Patient 08/24/22 1901     (approximate)   History   Chief Complaint Recurrent Skin Infections   HPI  Jacob Love is a 41 y.o. male with no significant past medical history who presents to the ED complaining of wound infection.  Patient reports that he has had a "hole" near the base of his right great toe for about the past 3 months, ever since he picked off a callus.  He states that for about the past week he has noticed increasing redness and swelling near the area of this wound and extending throughout his great toe and into the dorsum of his foot.  He denies any associated pain, has begun to notice purulent drainage from the wound.  He reports subjective fevers and chills over the past 2 days, but has not taken his temperature at home.  He denies any history of diabetes and does not take any medications on a regular basis.     Physical Exam   Triage Vital Signs: ED Triage Vitals  Enc Vitals Group     BP 08/24/22 1829 (!) 143/80     Pulse Rate 08/24/22 1829 (!) 108     Resp 08/24/22 1829 18     Temp 08/24/22 1829 (!) 103 F (39.4 C)     Temp Source 08/24/22 1829 Oral     SpO2 08/24/22 1829 (!) 88 %     Weight 08/24/22 1832 (!) 360 lb (163.3 kg)     Height --      Head Circumference --      Peak Flow --      Pain Score 08/24/22 1832 4     Pain Loc --      Pain Edu? --      Excl. in [place]? --     Most recent vital signs: Vitals:   08/24/22 1829  BP: (!) 143/80  Pulse: (!) 108  Resp: 18  Temp: (!) 103 F (39.4 C)  SpO2: (!) 88%    Constitutional: Alert and oriented. Eyes: Conjunctivae are normal. Head: Atraumatic. Nose: No congestion/rhinnorhea. Mouth/Throat: Mucous membranes are moist.  Cardiovascular: Tachycardic, regular rhythm. Grossly normal heart sounds.  2+ radial and DP pulses bilaterally. Respiratory: Normal respiratory effort.  No retractions.  Lungs CTAB. Gastrointestinal: Soft and nontender. No distention. Musculoskeletal: Large open wound near the base of right great toe with purulent drainage as well as surrounding erythema, warmth, and edema extending circumferentially through the great toe and along the dorsum of the foot to his ankle. Neurologic:  Normal speech and language. No gross focal neurologic deficits are appreciated.       ED Results / Procedures / Treatments   Labs (all labs ordered are listed, but only abnormal results are displayed) Labs Reviewed  COMPREHENSIVE METABOLIC PANEL - Abnormal; Notable for the following components:      Result Value   Sodium 130 (*)    Potassium 2.7 (*)    Chloride 91 (*)    Glucose, Bld 129 (*)    Total Protein 8.7 (*)    Albumin 3.4 (*)    All other components within normal limits  LACTIC ACID, PLASMA - Abnormal; Notable for the following components:   Lactic Acid, Venous 2.7 (*)    All other components within normal limits  CBC WITH DIFFERENTIAL/PLATELET - Abnormal; Notable for the following components:   WBC 20.8 (*)  Platelets 482 (*)    Neutro Abs 17.2 (*)    Monocytes Absolute 1.6 (*)    Abs Immature Granulocytes 0.16 (*)    All other components within normal limits  PROTIME-INR - Abnormal; Notable for the following components:   Prothrombin Time 15.6 (*)    INR 1.3 (*)    All other components within normal limits  CULTURE, BLOOD (ROUTINE X 2)  CULTURE, BLOOD (ROUTINE X 2)  AEROBIC/ANAEROBIC CULTURE W GRAM STAIN (SURGICAL/DEEP WOUND)  MAGNESIUM  LACTIC ACID, PLASMA  URINALYSIS, ROUTINE W REFLEX MICROSCOPIC   RADIOLOGY Foot x-ray reviewed and interpreted by me with soft tissue edema and lucency in the proximal phalanx concerning for osteomyelitis.  PROCEDURES:  Critical Care performed: Yes, see critical care procedure note(s)  .Critical Care  Performed by: Chesley Noon, MD Authorized by: Chesley Noon, MD   Critical care provider statement:     Critical care time (minutes):  30   Critical care time was exclusive of:  Separately billable procedures and treating other patients and teaching time   Critical care was necessary to treat or prevent imminent or life-threatening deterioration of the following conditions:  Sepsis   Critical care was time spent personally by me on the following activities:  Development of treatment plan with patient or surrogate, discussions with consultants, evaluation of patient's response to treatment, examination of patient, ordering and review of laboratory studies, ordering and review of radiographic studies, ordering and performing treatments and interventions, pulse oximetry, re-evaluation of patient's condition and review of old charts   I assumed direction of critical care for this patient from another provider in my specialty: no     Care discussed with: admitting provider      MEDICATIONS ORDERED IN ED: Medications  vancomycin (VANCOCIN) IVPB 1000 mg/200 mL premix (has no administration in time range)  piperacillin-tazobactam (ZOSYN) IVPB 3.375 g (3.375 g Intravenous New Bag/Given 08/24/22 1957)  potassium chloride 10 mEq in 100 mL IVPB (10 mEq Intravenous New Bag/Given 08/24/22 1959)  sodium chloride 0.9 % bolus 2,000 mL (2,000 mLs Intravenous New Bag/Given 08/24/22 2000)  acetaminophen (TYLENOL) tablet 1,000 mg (1,000 mg Oral Given 08/24/22 1957)  potassium chloride SA (KLOR-CON M) CR tablet 40 mEq (40 mEq Oral Given 08/24/22 1958)     IMPRESSION / MDM / ASSESSMENT AND PLAN / ED COURSE  I reviewed the triage vital signs and the nursing notes.                              41 y.o. male with no significant past medical history presents to the ED with chronic wound to the right great toe that has been worsening over the past few months, now with purulent drainage and subjective fevers/chills over the past 2 days.  Patient's presentation is most consistent with acute presentation with potential  threat to life or bodily function.  Differential diagnosis includes, but is not limited to, sepsis, osteomyelitis, abscess, cellulitis, electrolyte abnormality, AKI, new onset diabetes.  Patient nontoxic-appearing and in no acute distress, however vital signs are concerning for fever and tachycardia, consistent with sepsis.  Patient started on broad-spectrum antibiotics, blood cultures drawn and lactic acid noted to be elevated, will trend following IV fluid bolus.  Patient with significant leukocytosis consistent with sepsis, also noted to have hypokalemia which we will replete, no significant AKI or hypomagnesemia associated with this.  Source of sepsis appears to be osteomyelitis and cellulitis to his right  foot with evidence of osteomyelitis seen on x-ray.  Wound culture was obtained and patient started on broad-spectrum antibiotics.  Case discussed with hospitalist for admission.      FINAL CLINICAL IMPRESSION(S) / ED DIAGNOSES   Final diagnoses:  Sepsis without acute organ dysfunction, due to unspecified organism (HCC)  Osteomyelitis of right foot, unspecified type (HCC)     Rx / DC Orders   ED Discharge Orders     None        Note:  This document was prepared using Dragon voice recognition software and may include unintentional dictation errors.   Chesley Noon, MD 08/24/22 2021

## 2022-08-24 NOTE — H&P (Signed)
History and Physical    Chief Complaint: Right foot ulcer.    HISTORY OF PRESENT ILLNESS: Jacob Love is an 41 y.o. male  seen in ed for right foot ulcer.  He has had it for several months and recently has been draining and is malodorous.  Patient has been seen by podiatry in the past and has had a similar wound on his left foot that is healed.  Patient reports that his foot has deformity because of which he puts pressure on certain areas and ends up getting calluses that resulted into wounds. Patient states that he has been having some fevers and chills over the past 2 days. Has no known history of diabetes however chart review shows that his glucose has been elevated and suspect underlying diabetes mellitus type 2 secondary to obesity.     Pt has PMH as below: Past Medical History:  Diagnosis Date   Meniere disease      Review of Systems  Musculoskeletal:        Pt has open ulcer on 1st MTP.        No Known Allergies   History reviewed. No pertinent surgical history.    Social History   Socioeconomic History   Marital status: Divorced    Spouse name: Not on file   Number of children: Not on file   Years of education: Not on file   Highest education level: Not on file  Occupational History   Not on file  Tobacco Use   Smoking status: Every Day   Smokeless tobacco: Current  Substance and Sexual Activity   Alcohol use: Yes   Drug use: Never   Sexual activity: Not on file  Other Topics Concern   Not on file  Social History Narrative   Not on file   Social Determinants of Health   Financial Resource Strain: Not on file  Food Insecurity: No Food Insecurity (08/25/2022)   Hunger Vital Sign    Worried About Running Out of Food in the Last Year: Never true    Ran Out of Food in the Last Year: Never true  Transportation Needs: No Transportation Needs (08/25/2022)   PRAPARE - Hydrologist (Medical): No    Lack of  Transportation (Non-Medical): No  Physical Activity: Not on file  Stress: Not on file  Social Connections: Not on file      CURRENT MEDS:    Current Facility-Administered Medications (Cardiovascular):    hydrALAZINE (APRESOLINE) injection 10 mg   hydrochlorothiazide (HYDRODIURIL) tablet 25 mg     Current Facility-Administered Medications (Analgesics):    aspirin EC tablet 81 mg   Current Facility-Administered Medications (Hematological):    heparin injection 5,000 Units   Current Facility-Administered Medications (Other):    [START ON 08/26/2022] influenza vac split quadrivalent PF (FLUARIX) injection 0.5 mL   lactated ringers infusion   piperacillin-tazobactam (ZOSYN) IVPB 3.375 g   sodium chloride flush (NS) 0.9 % injection 3 mL  No current outpatient medications on file.   ED Course: Pt in Ed patient needs severe sepsis criteria and has been started on IV antibiotics. Vitals:   08/24/22 2034 08/24/22 2040 08/24/22 2320 08/24/22 2347  BP: 138/71  131/73 132/72  Pulse: 85  81 79  Resp: (!) 24  18 20   Temp: 99.5 F (37.5 C)  99.4 F (37.4 C) 99.2 F (37.3 C)  TempSrc: Oral  Oral Oral  SpO2: 98%  95% 97%  Weight:  (!) 163.3  kg    Height:  6\' 3"  (1.905 m)     Total I/O In: 2450 [IV Piggyback:2450] Out: -  SpO2: 97 % Blood work in ed shows electrolyte abnormalities, hyperglycemia, normal LFTs, leukocytosis with a white count of 20.8 normal hemoglobin and platelets of 482. Results for orders placed or performed during the hospital encounter of 08/24/22 (from the past 48 hour(s))  Comprehensive metabolic panel     Status: Abnormal   Collection Time: 08/24/22  6:52 PM  Result Value Ref Range   Sodium 130 (L) 135 - 145 mmol/L   Potassium 2.7 (LL) 3.5 - 5.1 mmol/L    Comment: CRITICAL RESULT CALLED TO, READ BACK BY AND VERIFIED WITH GABE LASTRE AT 1936 08/24/2022 DLB    Chloride 91 (L) 98 - 111 mmol/L   CO2 27 22 - 32 mmol/L   Glucose, Bld 129 (H) 70 - 99  mg/dL    Comment: Glucose reference range applies only to samples taken after fasting for at least 8 hours.   BUN 10 6 - 20 mg/dL   Creatinine, Ser 9.141.05 0.61 - 1.24 mg/dL   Calcium 9.1 8.9 - 78.210.3 mg/dL   Total Protein 8.7 (H) 6.5 - 8.1 g/dL   Albumin 3.4 (L) 3.5 - 5.0 g/dL   AST 33 15 - 41 U/L   ALT 19 0 - 44 U/L   Alkaline Phosphatase 101 38 - 126 U/L   Total Bilirubin 0.9 0.3 - 1.2 mg/dL   GFR, Estimated >95>60 >62>60 mL/min    Comment: (NOTE) Calculated using the CKD-EPI Creatinine Equation (2021)    Anion gap 12 5 - 15    Comment: Performed at [facility], [address] [place], [place] [address]  Lactic acid, plasma     Status: Abnormal   Collection Time: 08/24/22  6:52 PM  Result Value Ref Range   Lactic Acid, Venous 2.7 (HH) 0.5 - 1.9 mmol/L    Comment: CRITICAL RESULT CALLED TO, READ BACK BY AND VERIFIED WITH GABE LASTRE AT 1936 08/24/2022 DLB Performed at [facility], [address] [place], [place] [address]   CBC with Differential     Status: Abnormal   Collection Time: 08/24/22  6:52 PM  Result Value Ref Range   WBC 20.8 (H) 4.0 - 10.5 K/uL   RBC 5.02 4.22 - 5.81 MIL/uL   Hemoglobin 13.8 13.0 - 17.0 g/dL   HCT 96.241.8 95.239.0 - 84.152.0 %   MCV 83.3 80.0 - 100.0 fL   MCH 27.5 26.0 - 34.0 pg   MCHC 33.0 30.0 - 36.0 g/dL   RDW 32.413.7 40.111.5 - 02.715.5 %   Platelets 482 (H) 150 - 400 K/uL   nRBC 0.0 0.0 - 0.2 %   Neutrophils Relative % 83 %   Neutro Abs 17.2 (H) 1.7 - 7.7 K/uL   Lymphocytes Relative 8 %   Lymphs Abs 1.7 0.7 - 4.0 K/uL   Monocytes Relative 8 %   Monocytes Absolute 1.6 (H) 0.1 - 1.0 K/uL   Eosinophils Relative 0 %   Eosinophils Absolute 0.0 0.0 - 0.5 K/uL   Basophils Relative 0 %   Basophils Absolute 0.1 0.0 - 0.1 K/uL   Immature Granulocytes 1 %   Abs Immature Granulocytes 0.16 (H) 0.00 - 0.07 K/uL    Comment: Performed at [facility], [address] [place], [place] [address]  Protime-INR     Status: Abnormal   Collection  Time: 08/24/22  6:52 PM  Result Value  Ref Range   Prothrombin Time 15.6 (H) 11.4 - 15.2 seconds   INR 1.3 (H) 0.8 - 1.2    Comment: (NOTE) INR goal varies based on device and disease states. Performed at [facility], [address] [place], [place] [address]   Magnesium     Status: None   Collection Time: 08/24/22  7:38 PM  Result Value Ref Range   Magnesium 1.9 1.7 - 2.4 mg/dL    Comment: Performed at [facility], [address] [place], [place] [address]  Lactic acid, plasma     Status: None   Collection Time: 08/24/22  8:03 PM  Result Value Ref Range   Lactic Acid, Venous 1.3 0.5 - 1.9 mmol/L    Comment: Performed at [facility], [address] [place], [place] [address]  Urinalysis, Routine w reflex microscopic Urine, Clean Catch     Status: Abnormal   Collection Time: 08/24/22 11:21 PM  Result Value Ref Range   Color, Urine YELLOW (A) YELLOW   APPearance CLEAR (A) CLEAR   Specific Gravity, Urine 1.010 1.005 - 1.030   pH 6.0 5.0 - 8.0   Glucose, UA NEGATIVE NEGATIVE mg/dL   Hgb urine dipstick NEGATIVE NEGATIVE   Bilirubin Urine NEGATIVE NEGATIVE   Ketones, ur NEGATIVE NEGATIVE mg/dL   Protein, ur NEGATIVE NEGATIVE mg/dL   Nitrite NEGATIVE NEGATIVE   Leukocytes,Ua NEGATIVE NEGATIVE    Comment: Performed at [facility], [address] [place], [place] [address]    In Ed pt received  Meds ordered this encounter  Medications   sodium chloride 0.9 % bolus 2,000 mL   vancomycin (VANCOCIN) IVPB 1000 mg/200 mL premix    Order Specific Question:   Indication:    Answer:   Wound Infection    Order Specific Question:   Other Indication:    Answer:   wound infection and sepsis   acetaminophen (TYLENOL) tablet 1,000 mg   piperacillin-tazobactam (ZOSYN) IVPB 3.375 g    Order Specific Question:   Antibiotic Indication:    Answer:   Wound Infection   potassium chloride SA (KLOR-CON M) CR tablet 40 mEq   potassium chloride 10 mEq in  100 mL IVPB   hydrochlorothiazide (HYDRODIURIL) tablet 25 mg   aspirin EC tablet 81 mg   sodium chloride flush (NS) 0.9 % injection 3 mL   lactated ringers infusion   heparin injection 5,000 Units   DISCONTD: piperacillin-tazobactam (ZOSYN) IVPB 3.375 g    Order Specific Question:   Antibiotic Indication:    Answer:   Wound Infection   hydrALAZINE (APRESOLINE) injection 10 mg   piperacillin-tazobactam (ZOSYN) IVPB 3.375 g    Order Specific Question:   Antibiotic Indication:    Answer:   Cellulitis   influenza vac split quadrivalent PF (FLUARIX) injection 0.5 mL    Unresulted Labs (From admission, onward)     Start     Ordered   08/25/22 0500  Cortisol-am, blood  Tomorrow morning,   URGENT        08/24/22 2036   08/25/22 0500  Procalcitonin  Tomorrow morning,   URGENT        08/24/22 2036   08/25/22 0500  HIV Antibody (routine testing w rflx)  Once,   R        08/25/22 0500   08/24/22 2029  Hemoglobin A1c  Add-on,   AD        08/24/22 2028   08/24/22 1956  Aerobic/Anaerobic Culture w Gram  Stain (surgical/deep wound)  Once,   URGENT        08/24/22 1955   08/24/22 1833  Culture, blood (Routine x 2)  BLOOD CULTURE X 2,   STAT      08/24/22 1833           Admission Imaging : DG Foot 2 Views Right  Result Date: 08/24/2022 CLINICAL DATA:  Open wound right big toe EXAM: RIGHT FOOT - 2 VIEW COMPARISON:  08/20/2018 FINDINGS: Soft tissue defect in the right great toe. Underlying bone destruction within the great toe proximal phalanx concerning for osteomyelitis. Moderate degenerative changes at the 1st MTP joint with hallux valgus deformity. No fracture. IMPRESSION: Soft tissue defect in the right great toe with underlying bone lucency in the proximal phalanx of the right great toe concerning for osteomyelitis. Electronically Signed   By: Charlett Nose M.D.   On: 08/24/2022 19:29   DG Chest 1 View  Result Date: 08/24/2022 CLINICAL DATA:  Sepsis. EXAM: CHEST  1 VIEW COMPARISON:  None  Available. FINDINGS: The cardiomediastinal contours are normal. Upper normal heart size likely accentuated by AP technique. Subsegmental atelectasis the left lung base. Pulmonary vasculature is normal. No consolidation, pleural effusion, or pneumothorax. No acute osseous abnormalities are seen. IMPRESSION: Subsegmental atelectasis at the left lung base. Electronically Signed   By: Narda Rutherford M.D.   On: 08/24/2022 19:29      Physical Examination: Vitals:   08/24/22 2034 08/24/22 2040 08/24/22 2320 08/24/22 2347  BP: 138/71  131/73 132/72  Pulse: 85  81 79  Temp: 99.5 F (37.5 C)  99.4 F (37.4 C) 99.2 F (37.3 C)  Resp: (!) 24  18 20   Height:  6\' 3"  (1.905 m)    Weight:  (!) 163.3 kg    SpO2: 98%  95% 97%  TempSrc: Oral  Oral Oral  BMI (Calculated):  45     Physical Exam Vitals and nursing note reviewed.  Constitutional:      General: He is not in acute distress.    Appearance: He is obese. He is not ill-appearing, toxic-appearing or diaphoretic.  HENT:     Head: Normocephalic and atraumatic.     Right Ear: Hearing and external ear normal.     Left Ear: Hearing and external ear normal.     Nose: Nose normal. No nasal deformity.     Mouth/Throat:     Lips: Pink.     Mouth: Mucous membranes are moist.     Tongue: No lesions.  Eyes:     Extraocular Movements: Extraocular movements intact.  Cardiovascular:     Rate and Rhythm: Normal rate and regular rhythm.     Pulses: Normal pulses.     Heart sounds: Normal heart sounds.  Pulmonary:     Effort: Pulmonary effort is normal.     Breath sounds: Normal breath sounds.  Abdominal:     General: Bowel sounds are normal. There is no distension.     Palpations: Abdomen is soft. There is no mass.     Tenderness: There is no abdominal tenderness. There is no guarding.     Hernia: No hernia is present.  Skin:    General: Skin is warm.     Comments: Pulses intact patient has prominent livedo and but feet.  Neurological:      General: No focal deficit present.     Mental Status: He is alert and oriented to person, place, and time.     Cranial Nerves:  Cranial nerves 2-12 are intact.     Motor: Motor function is intact.  Psychiatric:        Attention and Perception: Attention normal.        Mood and Affect: Mood normal.        Speech: Speech normal.        Behavior: Behavior normal. Behavior is cooperative.        Cognition and Memory: Cognition normal.    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 Suspect osteomyelitis. A.m. team to consult podiatry. Wound consult order placed. We will continue with IV antibiotic therapy and gentle hydration overnight. CT imaging pending.        Severe sepsis [facility]) Patient meets severe sepsis criteria with white count heart rate fever source of infection lactic acidosis. Again continue with supportive care continue with broad-spectrum IV antibiotics continue with antipyretics as needed.  We will follow culture sensitivity.   Essential hypertension Blood pressure 132/72, pulse 79, temperature 99.2 F (37.3 C), temperature source Oral, resp. rate 20, height 6\' 3"  (1.905 m), weight (!) 163.3 kg, SpO2 97 %.  Vitals:   08/24/22 1829 08/24/22 2034 08/24/22 2320 08/24/22 2347  BP: (!) 143/80 138/71 131/73 132/72  Will continue patient on hydralazine as needed and continue HCTZ 25 mg daily.    Morbid obesity with BMI of 45.0-49.9, adult (HCC) Suspect diabetes mellitus type 2 secondary to morbid obesity. A1c is pending.  Smoker Patient has stopped smoking yesterday. Nicotine patch.  Transient cerebral ischemia History of TIA. We will continue patient on aspirin 81.   DVT prophylaxis:  Heparin  Code Status:  Full code  Family Communication:  Chandler,Kimberly (Significant other)  [phone] (Mobile)   Disposition Plan:  Home  Consults called:   None  Admission status: Inpatient  Unit/ Expected LOS: Med telemetry 2 to 3 days.   [phone] MD [facility]  6 PM- 2 AM. Please contact me via secure Chat 6 PM-2 AM. [phone] ( Pager ) To contact the [facility] Attending or Consulting provider 7A - 7P or covering provider during after hours 7P -7A, for this patient.   Check the care team in [facility] and look for a) attending/consulting [facility] provider listed and b) the [facility] team listed Log into [email or web address] and use [place]'s universal password to access. If you do not have the password, please contact the hospital operator. Locate the [facility] provider you are looking for under [facility] and page to a number that you can be directly reached. If you still have difficulty reaching the provider, please page the [facility] (Director on Call) for the Hospitalists listed on amion for assistance. [email or web address] 08/25/2022, 1:42 AM

## 2022-08-24 NOTE — Consult Note (Signed)
[facility] Nurse Consult Note: Reason for Consult:Right hallux full thickness ulcer. Wound type:neuropathic, infectious Pressure Injury POA: N/A Measurement:To be obtained by Bedside RN and documented on Nursing Flow Sheet with next dressing change Wound bed: red, wet Drainage (amount, consistency, odor) serous Periwound:Macerated, callused  Dressing procedure/placement/frequency: I will provide Nursing with conservative care guidance for topical care using a soap and water cleanse, rinse and dry followed by placement of a betadine moistened gauze dressing paced into defect, topped with dry gauze and secured with a few turns of Kerlix roll gauze/paper tape.   The patient is known to Podiatric Medicine and has been seen previously albeit not recently by Dr. Kalman Jewels.  I have communicated with Dr. Posey Pronto this evening and recommended that Podiatric Medicine be consulted for this wound and asked to follow while the patient is in house.   Pressure Injury Prevention interventions put into place this evening include placement of a silicone foam to the sacrum and provision of bilateral pressure redistribution heel boots (Prevalon).  [facility] nursing team will not follow, but will remain available to this patient, the nursing and medical teams.  Please re-consult if needed.  Thank you for inviting [place] to participate in this patient's Plan of Care.  Maudie Flakes, MSN, RN, CNS, [place], Serita Grammes, [employer], [employer] phone:  [phone]

## 2022-08-24 NOTE — Progress Notes (Signed)
Following for sepsis monitoring ?

## 2022-08-25 ENCOUNTER — Other Ambulatory Visit: Payer: Self-pay

## 2022-08-25 LAB — SURGICAL PCR SCREEN
MRSA, PCR: NEGATIVE
Staphylococcus aureus: NEGATIVE

## 2022-08-25 LAB — HIV ANTIBODY (ROUTINE TESTING W REFLEX): HIV Screen 4th Generation wRfx: NONREACTIVE

## 2022-08-25 LAB — CORTISOL-AM, BLOOD: Cortisol - AM: 20.4 ug/dL (ref 6.7–22.6)

## 2022-08-25 LAB — PROCALCITONIN: Procalcitonin: 0.62 ng/mL

## 2022-08-25 MED ORDER — CHLORHEXIDINE GLUCONATE 4 % EX LIQD: 60.0000 mL | Freq: Once | CUTANEOUS | Status: DC

## 2022-08-25 MED ORDER — ACETAMINOPHEN 325 MG PO TABS
650.0000 mg | ORAL_TABLET | Freq: Once | ORAL | Status: AC
  Administered 2022-08-25: 650 mg via ORAL
  Filled 2022-08-25: qty 2

## 2022-08-25 MED ORDER — INFLUENZA VAC SPLIT QUAD 0.5 ML IM SUSY: 0.5000 mL | PREFILLED_SYRINGE | INTRAMUSCULAR | Status: DC

## 2022-08-25 NOTE — Assessment & Plan Note (Addendum)
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 Suspect osteomyelitis. A.m. team to consult podiatry. Wound consult order placed. We will continue with IV antibiotic therapy and gentle hydration overnight. CT imaging pending.

## 2022-08-25 NOTE — Assessment & Plan Note (Signed)
Blood pressure 132/72, pulse 79, temperature 99.2 F (37.3 C), temperature source Oral, resp. rate 20, height 6\' 3"  (1.905 m), weight (!) 163.3 kg, SpO2 97 %.  Vitals:   08/24/22 1829 08/24/22 2034 08/24/22 2320 08/24/22 2347  BP: (!) 143/80 138/71 131/73 132/72  Will continue patient on hydralazine as needed and continue HCTZ 25 mg daily.

## 2022-08-25 NOTE — Assessment & Plan Note (Signed)
Patient meets severe sepsis criteria with white count heart rate fever source of infection lactic acidosis. Again continue with supportive care continue with broad-spectrum IV antibiotics continue with antipyretics as needed.  We will follow culture sensitivity.

## 2022-08-25 NOTE — Assessment & Plan Note (Signed)
Patient has stopped smoking yesterday. Nicotine patch.

## 2022-08-25 NOTE — Assessment & Plan Note (Signed)
Suspect diabetes mellitus type 2 secondary to morbid obesity. A1c is pending.

## 2022-08-25 NOTE — Plan of Care (Signed)

## 2022-08-25 NOTE — Consult Note (Signed)
PODIATRY / FOOT AND ANKLE SURGERY CONSULTATION NOTE  Requesting Physician: Eric [place] DO  Reason for consult: Right foot infection/wound  Chief Complaint: Right foot wound/infection   HPI: Jacob Love is a 41 y.o. male who presents with a nonhealing ulceration to the right big toe.  Patient reports that he has had it for several weeks or even months.  He does notice increased malodor as well as drainage and significant erythema and edema to the area.  He additionally had reported having fevers and chills and feeling generally unwell.  Patient has been followed in the past by podiatry as an outpatient but has not been seen for a number of years.  X-ray imaging was taken which showed osteomyelitis to the first metatarsal phalangeal joint and big toe with underlying sepsis.  Patient was admitted for further work-up.  Patient presents today resting comfortably in bed.  Patient notes complete numbness to the right forefoot.  He does not note any history of diabetes.  PMHx:  Past Medical History:  Diagnosis Date   Meniere disease     Surgical Hx: History reviewed. No pertinent surgical history.  FHx: History reviewed. No pertinent family history.  Social History:  reports that he has been smoking. He uses smokeless tobacco. He reports current alcohol use. He reports that he does not use drugs.  Allergies: No Known Allergies  Medications Prior to Admission  Medication Sig Dispense Refill   aspirin EC 81 MG tablet Take 81 mg by mouth daily. (Patient not taking: Reported on 08/24/2022)     hydrochlorothiazide (HYDRODIURIL) 25 MG tablet Take 25 mg by mouth daily. (Patient not taking: Reported on 08/24/2022)  3    Physical Exam: General: Alert and oriented.  No apparent distress. Vascular: DP/PT pulses palpable bilateral, capillary fill time appears to be intact to digits bilaterally, mild hair growth noted to both feet.  Neuro: Light touch sensation nearly absent to right  forefoot.  Derm: Significant large open ration present to the plantar aspect medially of the first metatarsal phalange joint that probes straight to bone, notable odor, corresponding erythema and edema present to the area, seropurulent drainage, large amount of maceration overall.    MSK: Hallux valgus contracture bilaterally.  Results for orders placed or performed during the hospital encounter of 08/24/22 (from the past 48 hour(s))  Comprehensive metabolic panel     Status: Abnormal   Collection Time: 08/24/22  6:52 PM  Result Value Ref Range   Sodium 130 (L) 135 - 145 mmol/L   Potassium 2.7 (LL) 3.5 - 5.1 mmol/L    Comment: CRITICAL RESULT CALLED TO, READ BACK BY AND VERIFIED WITH GABE LASTRE AT 1936 08/24/2022 DLB    Chloride 91 (L) 98 - 111 mmol/L   CO2 27 22 - 32 mmol/L   Glucose, Bld 129 (H) 70 - 99 mg/dL    Comment: Glucose reference range applies only to samples taken after fasting for at least 8 hours.   BUN 10 6 - 20 mg/dL   Creatinine, Ser 1.05 0.61 - 1.24 mg/dL   Calcium 9.1 8.9 - 10.3 mg/dL   Total Protein 8.7 (H) 6.5 - 8.1 g/dL   Albumin 3.4 (L) 3.5 - 5.0 g/dL   AST 33 15 - 41 U/L   ALT 19 0 - 44 U/L   Alkaline Phosphatase 101 38 - 126 U/L   Total Bilirubin 0.9 0.3 - 1.2 mg/dL   GFR, Estimated >60 >60 mL/min    Comment: (NOTE) Calculated using the  CKD-EPI Creatinine Equation (2021)    Anion gap 12 5 - 15    Comment: Performed at [facility], [address] [place], [place] [address]  Lactic acid, plasma     Status: Abnormal   Collection Time: 08/24/22  6:52 PM  Result Value Ref Range   Lactic Acid, Venous 2.7 (HH) 0.5 - 1.9 mmol/L    Comment: CRITICAL RESULT CALLED TO, READ BACK BY AND VERIFIED WITH GABE LASTRE AT 1936 08/24/2022 DLB Performed at [facility], [address] [place], [place] [address]   CBC with Differential     Status: Abnormal   Collection Time: 08/24/22  6:52 PM  Result Value Ref Range   WBC 20.8 (H) 4.0 - 10.5  K/uL   RBC 5.02 4.22 - 5.81 MIL/uL   Hemoglobin 13.8 13.0 - 17.0 g/dL   HCT 41.6 38.4 - 53.6 %   MCV 83.3 80.0 - 100.0 fL   MCH 27.5 26.0 - 34.0 pg   MCHC 33.0 30.0 - 36.0 g/dL   RDW 46.8 03.2 - 12.2 %   Platelets 482 (H) 150 - 400 K/uL   nRBC 0.0 0.0 - 0.2 %   Neutrophils Relative % 83 %   Neutro Abs 17.2 (H) 1.7 - 7.7 K/uL   Lymphocytes Relative 8 %   Lymphs Abs 1.7 0.7 - 4.0 K/uL   Monocytes Relative 8 %   Monocytes Absolute 1.6 (H) 0.1 - 1.0 K/uL   Eosinophils Relative 0 %   Eosinophils Absolute 0.0 0.0 - 0.5 K/uL   Basophils Relative 0 %   Basophils Absolute 0.1 0.0 - 0.1 K/uL   Immature Granulocytes 1 %   Abs Immature Granulocytes 0.16 (H) 0.00 - 0.07 K/uL    Comment: Performed at [facility], [address] [place], [place] [address]  Protime-INR     Status: Abnormal   Collection Time: 08/24/22  6:52 PM  Result Value Ref Range   Prothrombin Time 15.6 (H) 11.4 - 15.2 seconds   INR 1.3 (H) 0.8 - 1.2    Comment: (NOTE) INR goal varies based on device and disease states. Performed at [facility], [address] [place], [place] [address]   Culture, blood (Routine x 2)     Status: None (Preliminary result)   Collection Time: 08/24/22  6:52 PM   Specimen: BLOOD  Result Value Ref Range   Specimen Description BLOOD RIGHT ANTECUBITAL    Special Requests      BOTTLES DRAWN AEROBIC AND ANAEROBIC Blood Culture adequate volume   Culture      NO GROWTH < 12 HOURS Performed at [facility], [address] [place], [place] [address]    Report Status PENDING   Culture, blood (Routine x 2)     Status: None (Preliminary result)   Collection Time: 08/24/22  6:52 PM   Specimen: BLOOD  Result Value Ref Range   Specimen Description BLOOD LEFT ANTECUBITAL    Special Requests      BOTTLES DRAWN AEROBIC AND ANAEROBIC Blood Culture adequate volume   Culture      NO GROWTH < 12 HOURS Performed at [facility], [address]  [place], [place] [address]    Report Status PENDING   Magnesium     Status: None   Collection Time: 08/24/22  7:38 PM  Result Value Ref Range   Magnesium 1.9 1.7 - 2.4 mg/dL    Comment: Performed at [facility], [address]  [place], [place] [address]  Lactic acid, plasma     Status: None   Collection Time: 08/24/22  8:03 PM  Result Value Ref Range   Lactic Acid, Venous 1.3 0.5 - 1.9 mmol/L    Comment: Performed at [facility], [address] [place], [place] [address]  Aerobic/Anaerobic Culture w Gram Stain (surgical/deep wound)     Status: None (Preliminary result)   Collection Time: 08/24/22  8:03 PM   Specimen: Wound  Result Value Ref Range   Specimen Description      WOUND Performed at [facility], [address], [place], [place] [address]    Special Requests      RT FOOT Performed at [facility], [address] [place], [place] [address]    Gram Stain      FEW WBC PRESENT,BOTH PMN AND MONONUCLEAR MODERATE GRAM POSITIVE COCCI IN PAIRS AND CHAINS FEW GRAM NEGATIVE RODS Performed at [facility], [address] [place], [place] [address]    Culture PENDING    Report Status PENDING   Urinalysis, Routine w reflex microscopic Urine, Clean Catch     Status: Abnormal   Collection Time: 08/24/22 11:21 PM  Result Value Ref Range   Color, Urine YELLOW (A) YELLOW   APPearance CLEAR (A) CLEAR   Specific Gravity, Urine 1.010 1.005 - 1.030   pH 6.0 5.0 - 8.0   Glucose, UA NEGATIVE NEGATIVE mg/dL   Hgb urine dipstick NEGATIVE NEGATIVE   Bilirubin Urine NEGATIVE NEGATIVE   Ketones, ur NEGATIVE NEGATIVE mg/dL   Protein, ur NEGATIVE NEGATIVE mg/dL   Nitrite NEGATIVE NEGATIVE   Leukocytes,Ua NEGATIVE NEGATIVE    Comment: Performed at [facility], [address] [place], [place] [address]  Cortisol-am, blood     Status: None   Collection Time: 08/25/22  5:07 AM  Result Value Ref Range   Cortisol - AM 20.4 6.7 - 22.6  ug/dL    Comment: Performed at [facility], [address], [place], [place] [address]  Procalcitonin     Status: None   Collection Time: 08/25/22  5:07 AM  Result Value Ref Range   Procalcitonin 0.62 ng/mL    Comment:        Interpretation: PCT > 0.5 ng/mL and <= 2 ng/mL: Systemic infection (sepsis) is possible, but other conditions are known to elevate PCT as well. (NOTE)       Sepsis PCT Algorithm           Lower Respiratory Tract                                      Infection PCT Algorithm    ----------------------------     ----------------------------         PCT < 0.25 ng/mL                PCT < 0.10 ng/mL          Strongly encourage             Strongly discourage   discontinuation of antibiotics    initiation of antibiotics    ----------------------------     -----------------------------       PCT 0.25 - 0.50 ng/mL            PCT 0.10 - 0.25 ng/mL  OR       >80% decrease in PCT            Discourage initiation of                                            antibiotics      Encourage discontinuation           of antibiotics    ----------------------------     -----------------------------         PCT >= 0.50 ng/mL              PCT 0.26 - 0.50 ng/mL                AND       <80% decrease in PCT             Encourage initiation of                                             antibiotics       Encourage continuation           of antibiotics    ----------------------------     -----------------------------        PCT >= 0.50 ng/mL                  PCT > 0.50 ng/mL               AND         increase in PCT                  Strongly encourage                                      initiation of antibiotics    Strongly encourage escalation           of antibiotics                                     -----------------------------                                           PCT <= 0.25 ng/mL                                                 OR                                         > 80% decrease in PCT                                      Discontinue / Do not initiate  antibiotics  Performed at [facility], [address] [place], [place] [address]   HIV Antibody (routine testing w rflx)     Status: None   Collection Time: 08/25/22  5:07 AM  Result Value Ref Range   HIV Screen 4th Generation wRfx Non Reactive Non Reactive    Comment: Performed at [facility], [address] [place], [place] [address]   DG Foot 2 Views Right  Result Date: 08/24/2022 CLINICAL DATA:  Open wound right big toe EXAM: RIGHT FOOT - 2 VIEW COMPARISON:  08/20/2018 FINDINGS: Soft tissue defect in the right great toe. Underlying bone destruction within the great toe proximal phalanx concerning for osteomyelitis. Moderate degenerative changes at the 1st MTP joint with hallux valgus deformity. No fracture. IMPRESSION: Soft tissue defect in the right great toe with underlying bone lucency in the proximal phalanx of the right great toe concerning for osteomyelitis. Electronically Signed   By: Charlett NoseKevin  Dover M.D.   On: 08/24/2022 19:29   DG Chest 1 View  Result Date: 08/24/2022 CLINICAL DATA:  Sepsis. EXAM: CHEST  1 VIEW COMPARISON:  None Available. FINDINGS: The cardiomediastinal contours are normal. Upper normal heart size likely accentuated by AP technique. Subsegmental atelectasis the left lung base. Pulmonary vasculature is normal. No consolidation, pleural effusion, or pneumothorax. No acute osseous abnormalities are seen. IMPRESSION: Subsegmental atelectasis at the left lung base. Electronically Signed   By: Narda RutherfordMelanie  Sanford M.D.   On: 08/24/2022 19:29    Blood pressure (!) 116/54, pulse 85, temperature 98.5 F (36.9 C), temperature source Oral, resp. rate 16, height 6\' 3"  (1.905 m), weight (!) 163.3 kg, SpO2 99 %.   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 -X-ray imaging reviewed and discussed with patient detail showing osteomyelitis present to the first metatarsal phalangeal joint -Discussed treatment options with patient. -All treatment options were discussed with the patient both conservative and surgical attempts at correction clean potential risks and complications of surgical intervention at this time patient is elected for surgical intervention consisting of right partial first ray amputation.  No guarantees given.  Consent obtained.  Orders placed. -Patient to be n.p.o. at midnight for surgery tomorrow likely at 3:45 PM. -Betadine wet-to-dry dressing placed. -Surgical shoe ordered for heel contact weightbearing.  We will likely order physical therapy after procedure to work on this further. -Wound culture taken, so far growing gram-positive cocci and gram-negative rods.  Appreciate medicine recommendations for IV antibiotic therapy.  Rosetta PosnerAndrew Deklyn Trachtenberg, DPM 08/25/2022, 1:31 PM

## 2022-08-25 NOTE — Progress Notes (Signed)
       CROSS COVER NOTE  NAME: Jacob Love MRN: [id number] DOB : [birth year] ATTENDING PHYSICIAN: [place] Eric J, DO    Date of Service   08/25/2022   HPI/Events of Note   Temp 38C  Interventions   Assessment/Plan: Acetaminophen x1 Continue zosyn X      This document was prepared using Dragon voice recognition software and may include unintentional dictation errors.  Neomia Glass DNP, MBA, FNP-BC Nurse Practitioner [facility] Pager [phone]

## 2022-08-25 NOTE — Progress Notes (Signed)
PROGRESS NOTE    Jacob Love  EPP:[id number] DOB: 1981/01/07 DOA: 08/24/2022 PCP: [facility]    Brief Narrative:   Jacob Love is a 41 y.o. male with past medical history significant for essential hypertension, morbid obesity, hallux valgus who presented to [facility] ED on 10/30 with right great toe ulceration.  Patient reports ongoing for several weeks and now has become malodorous, draining, with surrounding erythema.  Patient reports additionally with fever/chills and feeling unwell.  Has been followed in the past by podiatry outpatient.  Denies headache, no chest pain, no palpitations, no shortness of breath, no abdominal pain.  In the ED, temperature 103.0 F, HR 108, RR 18, BP 143/80, SPO2 88% on room air.  Sodium 130, potassium 2.7, chloride 91, CO2 27, glucose 129, BUN 10, creatinine 1.05.  AST 33, ALT 19, total bilirubin 0.9.  WBC count 20.8, hemoglobin 13.8, platelets 42.  Urinalysis unrevealing.  Chest x-ray with subsegmental atelectasis left base, otherwise unrevealing.  Right foot x-ray with soft tissue defect right great toe with underlying bone lucency in the proximal phalanx of the right great toe concerning for osteomyelitis.  Patient was started on empiric antibiotics, receive IV fluid bolus.  Hospitalist service consulted for admission for further evaluation and management of right great toe osteomyelitis with underlying sepsis.  Assessment & Plan:   Sepsis, POA Right great toe osteomyelitis Patient presenting to ED with progressive swelling, erythema, malodor and drainage to his right great toe over the last several weeks.  Patient was febrile 103.0, tachycardic, with elevated WBC count of 20.8.  Right foot x-ray with soft tissue defect right great toe with underlying bone lucency in the proximal phalanx of the right great toe concern for osteomyelitis. -- Podiatry consulted -- Zosyn 3.375 g IV every 8 hours -- Continue IVF  with LR at 100 mL/h -- N.p.o. after midnight for anticipated surgical invention on 11/1  Hyponatremia Etiology likely secondary to hypovolemic hyponatremia in the setting of sepsis/osteomyelitis as above. -- Continue IV fluid hydration -- Repeat BMP in a.m.  Hypokalemia -- Potassium 2.7 on admission, repleted. -- Repeat electrolytes in a.m. tickly magnesium  Essential hypertension -- Continue HCTZ 25 mg p.o. daily -- Holding home aspirin  Morbid obesity Body mass index is 45 kg/m.  Discussed with patient needs for aggressive lifestyle changes/weight loss as this complicates all facets of care.  Outpatient follow-up with PCP.  May benefit from bariatric evaluation outpatient.    DVT prophylaxis: heparin injection 5,000 Units Start: 08/24/22 2200    Code Status: Full Code Family Communication: No family present at bedside  Disposition Plan:  Level of care: Telemetry Medical Status is: Inpatient Remains inpatient appropriate because: I antibiotics, pending surgical invention for right great toe osteomyelitis tomorrow    Consultants:  Podiatry, Dr. Luana Shu  Procedures:  None  Antimicrobials:  Zosyn 10/30>>   Subjective: Patient seen examined bedside, resting comfortably.  Overall feels much improved since initiation of IV antibiotics.  Slightly upset that he is going to miss his stepson's graduation from basic training at [employer] today; but understands that he needs this severe infection addressed at this point.  No other questions or concerns at this time.  Denies headache, no dizziness, no chest pain, no palpitations, no shortness of breath, no abdominal pain, no current fever, no current chills, no night sweats, no nausea/nausea/diarrhea, no focal weakness, no fatigue, no paresthesias.  No acute events overnight per nursing staff.  Objective: Vitals:  08/24/22 2040 08/24/22 2320 08/24/22 2347 08/25/22 0723  BP:  131/73 132/72 (!) 116/54  Pulse:  81 79 85  Resp:   18 20 16   Temp:  99.4 F (37.4 C) 99.2 F (37.3 C) 98.5 F (36.9 C)  TempSrc:  Oral Oral Oral  SpO2:  95% 97% 99%  Weight: (!) 163.3 kg     Height: 6\' 3"  (1.905 m)       Intake/Output Summary (Last 24 hours) at 08/25/2022 0915 Last data filed at 08/25/2022 0836 Gross per 24 hour  Intake 3394.74 ml  Output 1000 ml  Net 2394.74 ml   Filed Weights   08/24/22 1832 08/24/22 2040  Weight: (!) 163.3 kg (!) 163.3 kg    Examination:  Physical Exam: GEN: NAD, alert and oriented x 3, obese HEENT: NCAT, PERRL, EOMI, sclera clear, MMM, poor dentition PULM: CTAB w/o wheezes/crackles, normal respiratory effort, on room air CV: RRR w/o M/G/R GI: abd soft, NTND, NABS, no R/G/M MSK: + peripheral edema, muscle strength globally intact 5/5 bilateral upper/lower extremities NEURO: CN II-XII intact, no focal deficits, sensation to light touch intact PSYCH: normal mood/affect Integumentary: Right plantar aspect of great toe/metatarsal region with defect, malodorous, erythematous with surrounding swelling as depicted below       Data Reviewed: I have personally reviewed following labs and imaging studies  CBC: Recent Labs  Lab 08/24/22 1852  WBC 20.8*  NEUTROABS 17.2*  HGB 13.8  HCT 41.8  MCV 83.3  PLT 482*   Basic Metabolic Panel: Recent Labs  Lab 08/24/22 1852 08/24/22 1938  NA 130*  --   K 2.7*  --   CL 91*  --   CO2 27  --   GLUCOSE 129*  --   BUN 10  --   CREATININE 1.05  --   CALCIUM 9.1  --   MG  --  1.9   GFR: Estimated Creatinine Clearance: 148.8 mL/min (by C-G formula based on SCr of 1.05 mg/dL). Liver Function Tests: Recent Labs  Lab 08/24/22 1852  AST 33  ALT 19  ALKPHOS 101  BILITOT 0.9  PROT 8.7*  ALBUMIN 3.4*   No results for input(s): "LIPASE", "AMYLASE" in the last 168 hours. No results for input(s): "AMMONIA" in the last 168 hours. Coagulation Profile: Recent Labs  Lab 08/24/22 1852  INR 1.3*   Cardiac Enzymes: No results for  input(s): "CKTOTAL", "CKMB", "CKMBINDEX", "TROPONINI" in the last 168 hours. BNP (last 3 results) No results for input(s): "PROBNP" in the last 8760 hours. HbA1C: No results for input(s): "HGBA1C" in the last 72 hours. CBG: No results for input(s): "GLUCAP" in the last 168 hours. Lipid Profile: No results for input(s): "CHOL", "HDL", "LDLCALC", "TRIG", "CHOLHDL", "LDLDIRECT" in the last 72 hours. Thyroid Function Tests: No results for input(s): "TSH", "T4TOTAL", "FREET4", "T3FREE", "THYROIDAB" in the last 72 hours. Anemia Panel: No results for input(s): "VITAMINB12", "FOLATE", "FERRITIN", "TIBC", "IRON", "RETICCTPCT" in the last 72 hours. Sepsis Labs: Recent Labs  Lab 08/24/22 1852 08/24/22 2003 08/25/22 0507  PROCALCITON  --   --  0.62  LATICACIDVEN 2.7* 1.3  --     Recent Results (from the past 240 hour(s))  Culture, blood (Routine x 2)     Status: None (Preliminary result)   Collection Time: 08/24/22  6:52 PM   Specimen: BLOOD  Result Value Ref Range Status   Specimen Description BLOOD RIGHT ANTECUBITAL  Final   Special Requests   Final    BOTTLES DRAWN AEROBIC AND ANAEROBIC  Blood Culture adequate volume   Culture   Final    NO GROWTH < 12 HOURS Performed at [facility], [address] [place], [place] [address]    Report Status PENDING  Incomplete  Culture, blood (Routine x 2)     Status: None (Preliminary result)   Collection Time: 08/24/22  6:52 PM   Specimen: BLOOD  Result Value Ref Range Status   Specimen Description BLOOD LEFT ANTECUBITAL  Final   Special Requests   Final    BOTTLES DRAWN AEROBIC AND ANAEROBIC Blood Culture adequate volume   Culture   Final    NO GROWTH < 12 HOURS Performed at [facility], [address] [place], [place] [address]    Report Status PENDING  Incomplete  Aerobic/Anaerobic Culture w Gram Stain (surgical/deep wound)     Status: None (Preliminary result)   Collection Time: 08/24/22  8:03 PM   Specimen:  Wound  Result Value Ref Range Status   Specimen Description   Final    WOUND Performed at [facility], [address], [place], [place] [address]    Special Requests   Final    RT FOOT Performed at [facility], [address] [place], [place] [address]    Gram Stain   Final    FEW WBC PRESENT,BOTH PMN AND MONONUCLEAR MODERATE GRAM POSITIVE COCCI IN PAIRS AND CHAINS FEW GRAM NEGATIVE RODS Performed at [facility], [address] [place], [place] [address]    Culture PENDING  Incomplete   Report Status PENDING  Incomplete         Radiology Studies: DG Foot 2 Views Right  Result Date: 08/24/2022 CLINICAL DATA:  Open wound right big toe EXAM: RIGHT FOOT - 2 VIEW COMPARISON:  08/20/2018 FINDINGS: Soft tissue defect in the right great toe. Underlying bone destruction within the great toe proximal phalanx concerning for osteomyelitis. Moderate degenerative changes at the 1st MTP joint with hallux valgus deformity. No fracture. IMPRESSION: Soft tissue defect in the right great toe with underlying bone lucency in the proximal phalanx of the right great toe concerning for osteomyelitis. Electronically Signed   By: Charlett Nose M.D.   On: 08/24/2022 19:29   DG Chest 1 View  Result Date: 08/24/2022 CLINICAL DATA:  Sepsis. EXAM: CHEST  1 VIEW COMPARISON:  None Available. FINDINGS: The cardiomediastinal contours are normal. Upper normal heart size likely accentuated by AP technique. Subsegmental atelectasis the left lung base. Pulmonary vasculature is normal. No consolidation, pleural effusion, or pneumothorax. No acute osseous abnormalities are seen. IMPRESSION: Subsegmental atelectasis at the left lung base. Electronically Signed   By: Narda Rutherford M.D.   On: 08/24/2022 19:29        Scheduled Meds:  heparin  5,000 Units Subcutaneous Q8H   hydrochlorothiazide  25 mg Oral Daily   [START ON 08/26/2022] influenza vac split quadrivalent PF  0.5 mL  Intramuscular Tomorrow-1000   sodium chloride flush  3 mL Intravenous Q12H   Continuous Infusions:  lactated ringers 100 mL/hr at 08/25/22 0006   piperacillin-tazobactam (ZOSYN)  IV 12.5 mL/hr at 08/25/22 0836     LOS: 1 day    Time spent: 51 minutes spent on chart review, discussion with nursing staff, consultants, updating family and interview/physical exam; more than 50% of that time was spent in counseling and/or coordination of care.    Alvira Philips [place], DO [facility] Available via Epic secure chat 7am-7pm After these hours, please refer to coverage  provider listed on [email or web address] 08/25/2022, 9:15 AM

## 2022-08-25 NOTE — Assessment & Plan Note (Signed)
History of TIA. We will continue patient on aspirin 81.

## 2022-08-26 ENCOUNTER — Encounter
Admission: EM | Disposition: A | Discharge status: Home / Self Care | Payer: Self-pay | Source: Home / Self Care | Attending: Internal Medicine

## 2022-08-26 ENCOUNTER — Inpatient Hospital Stay: Payer: Medicaid Other | Admitting: Anesthesiology

## 2022-08-26 ENCOUNTER — Encounter: Payer: Self-pay | Admitting: Internal Medicine

## 2022-08-26 ENCOUNTER — Inpatient Hospital Stay: Payer: Medicaid Other

## 2022-08-26 ENCOUNTER — Other Ambulatory Visit: Payer: Self-pay

## 2022-08-26 HISTORY — PX: AMPUTATION: SHX166

## 2022-08-26 LAB — CBC
HCT: 37.4 % — ABNORMAL LOW (ref 39.0–52.0)
Hemoglobin: 11.9 g/dL — ABNORMAL LOW (ref 13.0–17.0)
MCH: 27.4 pg (ref 26.0–34.0)
MCHC: 31.8 g/dL (ref 30.0–36.0)
MCV: 86 fL (ref 80.0–100.0)
Platelets: 351 10*3/uL (ref 150–400)
RBC: 4.35 MIL/uL (ref 4.22–5.81)
RDW: 13.8 % (ref 11.5–15.5)
WBC: 7.2 10*3/uL (ref 4.0–10.5)
nRBC: 0 % (ref 0.0–0.2)

## 2022-08-26 LAB — BASIC METABOLIC PANEL
Anion gap: 6 (ref 5–15)
BUN: 10 mg/dL (ref 6–20)
CO2: 30 mmol/L (ref 22–32)
Calcium: 8.4 mg/dL — ABNORMAL LOW (ref 8.9–10.3)
Chloride: 98 mmol/L (ref 98–111)
Creatinine, Ser: 0.86 mg/dL (ref 0.61–1.24)
GFR, Estimated: >60 mL/min (ref >60)
Glucose, Bld: 107 mg/dL — ABNORMAL HIGH (ref 70–99)
Potassium: 3.1 mmol/L — ABNORMAL LOW (ref 3.5–5.1)
Sodium: 134 mmol/L — ABNORMAL LOW (ref 135–145)

## 2022-08-26 LAB — MAGNESIUM: Magnesium: 2 mg/dL (ref 1.7–2.4)

## 2022-08-26 LAB — HEMOGLOBIN A1C
Hgb A1c MFr Bld: 5.9 % — ABNORMAL HIGH (ref 4.8–5.6)
Mean Plasma Glucose: 122.63 mg/dL

## 2022-08-26 SURGERY — AMPUTATION, FOOT, RAY
Anesthesia: General | Laterality: Right

## 2022-08-26 MED ORDER — FENTANYL CITRATE (PF) 100 MCG/2ML IJ SOLN: 25.0000 ug | INTRAMUSCULAR | Status: DC | PRN

## 2022-08-26 MED ORDER — HYDROCODONE-ACETAMINOPHEN 5-325 MG PO TABS
1.0000 | ORAL_TABLET | Freq: Four times a day (QID) | ORAL | Status: DC | PRN
  Administered 2022-08-26: 1 via ORAL
  Administered 2022-08-27: 2 via ORAL
  Filled 2022-08-26: qty 1
  Filled 2022-08-26: qty 2

## 2022-08-26 MED ORDER — BUPIVACAINE HCL (PF) 0.5 % IJ SOLN
INTRAMUSCULAR | Status: AC
  Filled 2022-08-26: qty 30

## 2022-08-26 MED ORDER — POTASSIUM CHLORIDE 10 MEQ/100ML IV SOLN
10.0000 meq | INTRAVENOUS | Status: AC
  Administered 2022-08-26 (×2): 10 meq via INTRAVENOUS
  Filled 2022-08-26 (×2): qty 100

## 2022-08-26 MED ORDER — CHLORHEXIDINE GLUCONATE 0.12 % MT SOLN
OROMUCOSAL | Status: AC
  Administered 2022-08-26: 15 mL via OROMUCOSAL
  Filled 2022-08-26: qty 15

## 2022-08-26 MED ORDER — INFLUENZA VAC SPLIT QUAD 0.5 ML IM SUSY
0.5000 mL | PREFILLED_SYRINGE | INTRAMUSCULAR | Status: AC
  Administered 2022-08-27: 0.5 mL via INTRAMUSCULAR
  Filled 2022-08-26: qty 0.5

## 2022-08-26 MED ORDER — 0.9 % SODIUM CHLORIDE (POUR BTL) OPTIME
TOPICAL | Status: DC | PRN
  Administered 2022-08-26: 500 mL

## 2022-08-26 MED ORDER — PROPOFOL 10 MG/ML IV BOLUS
INTRAVENOUS | Status: DC | PRN
  Administered 2022-08-26: 100 mg via INTRAVENOUS

## 2022-08-26 MED ORDER — PROMETHAZINE HCL 25 MG/ML IJ SOLN: 6.2500 mg | INTRAMUSCULAR | Status: DC | PRN

## 2022-08-26 MED ORDER — MIDAZOLAM HCL 2 MG/2ML IJ SOLN
INTRAMUSCULAR | Status: AC
  Filled 2022-08-26: qty 2

## 2022-08-26 MED ORDER — MIDAZOLAM HCL 2 MG/2ML IJ SOLN
INTRAMUSCULAR | Status: DC | PRN
  Administered 2022-08-26: 2 mg via INTRAVENOUS

## 2022-08-26 MED ORDER — LACTATED RINGERS IV SOLN: INTRAVENOUS | Status: DC

## 2022-08-26 MED ORDER — PROPOFOL 500 MG/50ML IV EMUL
INTRAVENOUS | Status: DC | PRN
  Administered 2022-08-26: 150 ug/kg/min via INTRAVENOUS

## 2022-08-26 MED ORDER — LACTATED RINGERS IV SOLN: INTRAVENOUS | Status: DC | PRN

## 2022-08-26 MED ORDER — CEFAZOLIN IN SODIUM CHLORIDE 3-0.9 GM/100ML-% IV SOLN
3.0000 g | Freq: Once | INTRAVENOUS | Status: AC
  Administered 2022-08-26: 3 g via INTRAVENOUS
  Filled 2022-08-26: qty 100

## 2022-08-26 MED ORDER — ACETAMINOPHEN 325 MG PO TABS
325.0000 mg | ORAL_TABLET | Freq: Four times a day (QID) | ORAL | Status: DC | PRN
  Administered 2022-08-26: 325 mg via ORAL
  Filled 2022-08-26: qty 1

## 2022-08-26 MED ORDER — ORAL CARE MOUTH RINSE: 15.0000 mL | Freq: Once | OROMUCOSAL | Status: AC

## 2022-08-26 MED ORDER — LIDOCAINE HCL (PF) 2 % IJ SOLN
INTRAMUSCULAR | Status: DC | PRN
  Administered 2022-08-26: 50 mg via INTRADERMAL

## 2022-08-26 MED ORDER — KETAMINE HCL 10 MG/ML IJ SOLN
INTRAMUSCULAR | Status: DC | PRN
  Administered 2022-08-26: 50 mg via INTRAVENOUS

## 2022-08-26 MED ORDER — KETAMINE HCL 50 MG/5ML IJ SOSY
PREFILLED_SYRINGE | INTRAMUSCULAR | Status: AC
  Filled 2022-08-26: qty 5

## 2022-08-26 MED ORDER — CHLORHEXIDINE GLUCONATE 0.12 % MT SOLN: 15.0000 mL | Freq: Once | OROMUCOSAL | Status: AC

## 2022-08-26 MED ORDER — BUPIVACAINE HCL 0.5 % IJ SOLN
INTRAMUSCULAR | Status: DC | PRN
  Administered 2022-08-26: 20 mL

## 2022-08-26 MED ORDER — PROPOFOL 1000 MG/100ML IV EMUL
INTRAVENOUS | Status: AC
  Filled 2022-08-26: qty 100

## 2022-08-26 SURGICAL SUPPLY — 38 items
BLADE MED AGGRESSIVE (BLADE) ×1 IMPLANT
BLADE OSC/SAGITTAL MD 5.5X18 (BLADE) IMPLANT
BNDG ELASTIC 4X5.8 VLCR STR LF (GAUZE/BANDAGES/DRESSINGS) ×1 IMPLANT
BNDG ESMARK 4X12 TAN STRL LF (GAUZE/BANDAGES/DRESSINGS) ×1 IMPLANT
BNDG GAUZE DERMACEA FLUFF 4 (GAUZE/BANDAGES/DRESSINGS) ×1 IMPLANT
CNTNR SPEC 2.5X3XGRAD LEK (MISCELLANEOUS) ×1
CONT SPEC 4OZ STER OR WHT (MISCELLANEOUS) ×1
CONT SPEC 4OZ STRL OR WHT (MISCELLANEOUS) ×1
CONTAINER SPEC 2.5X3XGRAD LEK (MISCELLANEOUS) ×1 IMPLANT
DRSG XEROFORM 1X8 (GAUZE/BANDAGES/DRESSINGS) IMPLANT
ELECT REM PT RETURN 9FT ADLT (ELECTROSURGICAL) ×1
ELECTRODE REM PT RTRN 9FT ADLT (ELECTROSURGICAL) ×1 IMPLANT
GAUZE SPONGE 4X4 12PLY STRL (GAUZE/BANDAGES/DRESSINGS) ×2 IMPLANT
GAUZE STRETCH 2X75IN STRL (MISCELLANEOUS) IMPLANT
GLOVE BIO SURGEON STRL SZ7 (GLOVE) ×1 IMPLANT
GLOVE SURG UNDER LTX SZ7 (GLOVE) ×1 IMPLANT
GOWN STRL REUS W/ TWL LRG LVL3 (GOWN DISPOSABLE) ×2 IMPLANT
GOWN STRL REUS W/TWL LRG LVL3 (GOWN DISPOSABLE) ×2
KIT TURNOVER KIT A (KITS) ×1 IMPLANT
MANIFOLD NEPTUNE II (INSTRUMENTS) ×1 IMPLANT
NDL HYPO 25X1 1.5 SAFETY (NEEDLE) ×2 IMPLANT
NEEDLE HYPO 25X1 1.5 SAFETY (NEEDLE) ×2 IMPLANT
NS IRRIG 500ML POUR BTL (IV SOLUTION) ×1 IMPLANT
PACK EXTREMITY ARMC (MISCELLANEOUS) ×1 IMPLANT
PAD ABD DERMACEA PRESS 5X9 (GAUZE/BANDAGES/DRESSINGS) IMPLANT
PULSAVAC PLUS IRRIG FAN TIP (DISPOSABLE) ×1
SOL .9 NS 3000ML IRR  AL (IV SOLUTION) ×1
SOL .9 NS 3000ML IRR UROMATIC (IV SOLUTION) IMPLANT
SOL PREP PVP 2OZ (MISCELLANEOUS) ×1
SOLUTION PREP PVP 2OZ (MISCELLANEOUS) ×1 IMPLANT
STOCKINETTE STRL 6IN 960660 (GAUZE/BANDAGES/DRESSINGS) ×1 IMPLANT
SUT ETHILON 3-0 FS-10 30 BLK (SUTURE) ×2
SUT VIC AB 3-0 SH 27 (SUTURE) ×3
SUT VIC AB 3-0 SH 27X BRD (SUTURE) ×1 IMPLANT
SUTURE EHLN 3-0 FS-10 30 BLK (SUTURE) ×2 IMPLANT
SYR 10ML LL (SYRINGE) ×1 IMPLANT
TIP FAN IRRIG PULSAVAC PLUS (DISPOSABLE) IMPLANT
TRAP FLUID SMOKE EVACUATOR (MISCELLANEOUS) ×1 IMPLANT

## 2022-08-26 NOTE — Transfer of Care (Signed)
Immediate Anesthesia Transfer of Care Note  Patient: Jacob Love  Procedure(s) Performed: RIGHT PARTIAL 1ST RAY AMPUTATION (Right)  Patient Location: PACU  Anesthesia Type:MAC  Level of Consciousness: awake  Airway & Oxygen Therapy: Patient Spontanous Breathing  Post-op Assessment: Report given to RN and Post -op Vital signs reviewed and stable  Post vital signs: Reviewed and stable  Last Vitals:  Vitals Value Taken Time  BP 99/64 08/26/22 1728  Temp 36.9 C 08/26/22 1726  Pulse 128 08/26/22 1732  Resp 15 08/26/22 1732  SpO2 100 % 08/26/22 1732  Vitals shown include unvalidated device data.  Last Pain:  Vitals:   08/26/22 1726  TempSrc:   PainSc: 0-No pain         Complications: No notable events documented.

## 2022-08-26 NOTE — Progress Notes (Signed)
Consultation Progress Note   Patient: Jacob Love SNK:[id number] DOB: Jul 26, 1981 DOA: 08/24/2022 DOS: the patient was seen and examined on 08/26/2022 Primary service: Shaleigh Laubscher, Manfred Shirts, MD  Brief hospital course: Jacob Love is a 41 y.o. male with past medical history significant for essential hypertension, morbid obesity, hallux valgus who presented to [facility] ED on 10/30 with right great toe ulceration.  Patient reports ongoing for several weeks and now has become malodorous, draining, with surrounding erythema.  Patient reports additionally with fever/chills and feeling unwell.  Has been followed in the past by podiatry outpatient.  Denies headache, no chest pain, no palpitations, no shortness of breath, no abdominal pain.   In the ED, temperature 103.0 F, HR 108, RR 18, BP 143/80, SPO2 88% on room air.  Sodium 130, potassium 2.7, chloride 91, CO2 27, glucose 129, BUN 10, creatinine 1.05.  AST 33, ALT 19, total bilirubin 0.9.  WBC count 20.8, hemoglobin 13.8, platelets 42.  Urinalysis unrevealing.  Chest x-ray with subsegmental atelectasis left base, otherwise unrevealing.  Right foot x-ray with soft tissue defect right great toe with underlying bone lucency in the proximal phalanx of the right great toe concerning for osteomyelitis.  Patient was started on empiric antibiotics, receive IV fluid bolus.  Hospitalist service consulted for admission for further evaluation and management of right great toe osteomyelitis with underlying sepsis  Assessment and Plan: Sepsis, POA Right great toe osteomyelitis Patient presenting to ED with progressive swelling, erythema, malodor and drainage to his right great toe over the last several weeks.  Patient was febrile 103.0, tachycardic, with elevated WBC count of 20.8.  Right foot x-ray with soft tissue defect right great toe with underlying bone lucency in the proximal phalanx of the right great toe concern for  osteomyelitis. -- Podiatry consulted -- Zosyn 3.375 g IV every 8 hours -- Continue IVF with LR at 100 mL/h -- N.p.o. after midnight for anticipated surgical invention on 11/1   Hyponatremia-improved 134 Etiology likely secondary to hypovolemic hyponatremia in the setting of sepsis/osteomyelitis as above. -- Continue IV fluid hydration -- Repeat BMP in a.m.   Hypokalemia-Replete intravenously -- Potassium 2.7 on admission, repleted. -- Repeat electrolytes in a.m. magnesium   Essential hypertension -- Continue HCTZ 25 mg p.o. daily -- Holding home aspirin   Morbid obesity Body mass index is 45 kg/m.  Discussed with patient needs for aggressive lifestyle changes/weight loss as this complicates all facets of care.  Outpatient follow-up with PCP.  May benefit from bariatric evaluation outpatient.       DVT prophylaxis: heparin injection 5,000 Units Start: 08/24/22 2200    Code Status: Full Code Family Communication: No family present at bedside   Disposition Plan:  Level of care: Telemetry Medical Status is: Inpatient Remains inpatient appropriate because: I antibiotics, pending surgical invention for right great toe osteomyelitis tomorrow     Consultants:  Podiatry, Dr. Luana Shu   Procedures:  None   Antimicrobials:  Zosyn 10/30>>       TR[facility] will continue to follow the patient.  Subjective: Spiked fever last night Tmax of 100.4 Scheduled for surgery today.  Physical Exam:  EN: NAD, alert and oriented x 3, obese HEENT: NCAT, PERRL, EOMI, sclera clear, MMM, poor dentition PULM: CTAB w/o wheezes/crackles, normal respiratory effort, on room air CV: RRR w/o M/G/R GI: abd soft, NTND, NABS, no R/G/M MSK: + peripheral edema, muscle strength globally intact 5/5 bilateral upper/lower extremities NEURO: CN II-XII intact, no focal deficits, sensation  to light touch intact PSYCH: normal mood/affect Integumentary: Right plantar aspect of great toe/metatarsal region with  defect, malodorous, erythematous with surrounding swelling as depicted below Vitals:   08/25/22 1547 08/25/22 2011 08/25/22 2334 08/26/22 0819  BP: 113/74 133/69 125/80 121/75  Pulse: 79 83 77 67  Resp: 16 19 18 18   Temp: 99.2 F (37.3 C) (!) 100.4 F (38 C) 98.6 F (37 C) 98.7 F (37.1 C)  TempSrc:  Oral Oral   SpO2: 97% 97% 96% 96%  Weight:      Height:       Data Reviewed:  There are no new results to review at this time.  Family Communication:   Time spent: 15  minutes.  Author: , MD 08/26/2022 11:31 AM  For on call review [email or web address].

## 2022-08-26 NOTE — Anesthesia Postprocedure Evaluation (Signed)
Anesthesia Post Note  Patient: Jacob Love  Procedure(s) Performed: RIGHT PARTIAL 1ST RAY AMPUTATION (Right)  Patient location during evaluation: PACU Anesthesia Type: General Level of consciousness: awake and alert Pain management: pain level controlled Vital Signs Assessment: post-procedure vital signs reviewed and stable Respiratory status: spontaneous breathing, nonlabored ventilation, respiratory function stable and patient connected to nasal cannula oxygen Cardiovascular status: blood pressure returned to baseline and stable Postop Assessment: no apparent nausea or vomiting Anesthetic complications: no   No notable events documented.   Last Vitals:  Vitals:   08/26/22 1745 08/26/22 1812  BP: (!) 121/92 124/83  Pulse: 63 72  Resp: 15 16  Temp:  36.7 C  SpO2: 100% 98%    Last Pain:  Vitals:   08/26/22 1745  TempSrc:   PainSc: 0-No pain                 Arita Miss

## 2022-08-26 NOTE — Plan of Care (Signed)

## 2022-08-26 NOTE — Op Note (Signed)
PODIATRY / FOOT AND ANKLE SURGERY OPERATIVE REPORT    SURGEON: Caroline More, DPM  PRE-OPERATIVE DIAGNOSIS:  1.  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  Neuropathy 3.  Obesity  POST-OPERATIVE DIAGNOSIS: Same  PROCEDURE(S): Right partial first ray amputation with rotational skin flap closure -advanced 2.5 cm Right second metatarsal head bone biopsy  HEMOSTASIS: Right ankle tourniquet  ANESTHESIA: MAC  ESTIMATED BLOOD LOSS: 20 cc  FINDING(S): 1.  Osteomyelitic changes present to the first metatarsal head and base of the proximal phalanx of the right foot, necrosis present of the sesamoid apparatus as well 2.  Exposed second metatarsal head with dislocation of digit present second metatarsal phalangeal joint  PATHOLOGY/SPECIMEN(S): Right partial first ray amputation with proximal margin marked in purple ink, right second metatarsal head bone pathology specimen and bone culture  INDICATIONS:   Jacob Love is a 41 y.o. male who presents with a nonhealing ulceration to the plantar medial aspect of the first metatarsal phalangeal joint that measures approximately 2.5 x 2 x 1.5 cm and probes directly to bone.  Patient has had this wound for a number of months to potentially even a year but has neglected treatment.  As result patient began having nausea and vomiting as well as fever and chills and was subsequently seen in the emergency room for this issue where he was noted to have osteomyelitis based on x-ray imaging and clinical examination.  No bone appeared to be exposed to the second metatarsal phalangeal joint through the open ulceration and there did not appear to be any x-ray changes consistent with osteomyelitis to the second metatarsal phalangeal joint.  All treatment options were discussed with the patient both conservative and surgical attempts at correction clean potential risks and complications at  this time patient is elected for surgical procedure consisting of right partial first ray amputation with rotational skin flap closure and possible bone biopsy..  DESCRIPTION: After obtaining full informed written consent, the patient was brought back to the operating room and placed supine upon the operating table.  The patient received IV antibiotics prior to induction.  After obtaining adequate anesthesia, 20 cc of half percent Marcaine plain was injected about the right first and second rays without incident.  The patient was prepped and draped in the standard fashion.  An Esmarch bandage was used to exsanguinate the right lower extremity and pneumatic ankle tourniquet was inflated.  Attention was directed to the area of the ulcerative site where a incision was made excising the ulcerative site and the incision was extended across the medial aspect of the first metatarsal head to the dorsum of the foot, the distal aspect of the incision was extended and performed circumferentially around the hallux at the base of the proximal phalanx.  The wound measured approximately 2.5 x 2 x 1.5 cm.  The incision was made straight to bone.  At this time the first metatarsal phalangeal joint was identified and extensor tenotomy and capsulotomy was performed followed by release the collateral and suspensory ligaments as well as any connection to the plantar plate and sesamoid apparatus as well as flexor tendon.  The hallux was disarticulated and passed off the operative site.  The base the proximal phalanx appeared to be soft and gray in nature consistent with osteomyelitic changes.  There appeared to be some purulence also present around the flexor tendon extending into the sesamoid apparatus.  Circumferential dissection was then performed around the first metatarsal head.  The first metatarsal head distally appeared to have a great type of appearance with notable arthritic changes and bone spurs present at the medial and  dorsal aspect of the joint with about 50% of the articular cartilage being damaged.  The bone also appeared to be somewhat gray in nature.  Dissection was continued proximally to the midshaft of the first metatarsal.  The sagittal bone saw was then used through create an osteotomy with the appropriate beveling through the first metatarsal leading more lateral and dorsally than plantar medial.  The bone appeared to be somewhat soft in this area.  The first metatarsal head was then resected and passed off the operative site and the proximal margin was marked in purple ink and sent off as a pathology specimen with the remainder of the first metatarsal.  The sesamoids were then dissected free and resected and passed off the operative site along with the capsular tissue present in this area as well as flexor tendon and this was also passed off the operative site and sent off with the pathologic specimen.  Any necrotic and nonviable tissues were also resected and passed off the operative site.  The surgical site was flushed with copious amounts normal sterile saline with pulse lavage 3 L.  The second metatarsal head also appeared to be visible at the operative site.  The second toe was subluxed to almost even dislocated at the second metatarsal phalangeal joint.  The second metatarsal head cartilage appeared to be intact but the bone appeared to be somewhat soft.  At this time a bone culture was taken from the second metatarsal head as well as path specimen.  These were passed off the operative site and sent off.  Any bleeding vessels were then cauterized with electrocauterization.  The deep subcutaneous tissue and capsular structures reapproximated well coapted with 3-0 Vicryl.  The skin was then rotated into the appropriate position closing the 2.5 cm gap present from the ulcerative site as the flap was advanced and the subcutaneous tissue was reapproximated well coapted with 3-0 Vicryl.  The skin was then  reapproximated well coapted with combination of simple and horizontal mattress type stitching.  A postoperative dressing is applied consisting of Xeroform followed by 4 x 4 gauze, ABD, Kerlix, Ace wrap.  The pneumatic ankle tourniquet was deflated prior to placing the postoperative dressing and a prompt hyperemic response was noted all digits of the right foot.  The patient tolerated the procedure and anesthesia well was transferred to recovery room vital signs stable vascular status intact all toes of the right foot.  Following.  Postoperative monitoring the patient will be discharged back to the inpatient room with the appropriate orders and instructions.  Patient is to remain partial weightbearing with heel contact and surgical shoe for short distances and transfers.  Patient should not be up walking on his foot for long periods of time.  Patient is to also keep his foot elevated and keep dressings clean, dry, and intact.  PT order has been placed.  We will reevaluate patient tomorrow and assess wound.  Could potentially be discharged with close follow-up.  Believe most likely all infection was removed with amputation but path specimens and cultures will show more detail on this.  COMPLICATIONS: None  CONDITION: Good, stable  Caroline More, DPM

## 2022-08-26 NOTE — Anesthesia Preprocedure Evaluation (Signed)
Anesthesia Evaluation  Patient identified by MRN, date of birth, ID band Patient awake    Reviewed: Allergy & Precautions, H&P , NPO status , Patient's Chart, lab work & pertinent test results, reviewed documented beta blocker date and time   History of Anesthesia Complications Negative for: history of anesthetic complications  Airway Mallampati: I  TM Distance: >3 FB Neck ROM: full    Dental  (+) Dental Advidsory Given, Missing, Poor Dentition, Chipped   Pulmonary neg shortness of breath, neg COPD, neg recent URI, Current Smoker and Patient abstained from smoking.,    Pulmonary exam normal breath sounds clear to auscultation       Cardiovascular Exercise Tolerance: Good hypertension, (-) angina(-) Past MI and (-) Cardiac Stents Normal cardiovascular exam(-) dysrhythmias (-) Valvular Problems/Murmurs Rhythm:regular Rate:Normal     Neuro/Psych negative neurological ROS  negative psych ROS   GI/Hepatic Neg liver ROS, GERD  ,  Endo/Other  diabetes (borderline)Morbid obesity  Renal/GU negative Renal ROS  negative genitourinary   Musculoskeletal   Abdominal   Peds  Hematology negative hematology ROS (+)   Anesthesia Other Findings Past Medical History: No date: Hypertension No date: Meniere disease No date: Morbid obesity ([place])   Reproductive/Obstetrics negative OB ROS                             Anesthesia Physical Anesthesia Plan  ASA: 3  Anesthesia Plan: General   Post-op Pain Management:    Induction: Intravenous  PONV Risk Score and Plan: 1 and Propofol infusion and TIVA  Airway Management Planned: Natural Airway and Simple Face Mask  Additional Equipment:   Intra-op Plan:   Post-operative Plan:   Informed Consent: I have reviewed the patients History and Physical, chart, labs and discussed the procedure including the risks, benefits and alternatives for the proposed  anesthesia with the patient or authorized representative who has indicated his/her understanding and acceptance.     Dental Advisory Given  Plan Discussed with: Anesthesiologist, CRNA and Surgeon  Anesthesia Plan Comments:         Anesthesia Quick Evaluation

## 2022-08-26 NOTE — H&P (Signed)
HISTORY AND PHYSICAL INTERVAL NOTE:  08/26/2022  3:54 PM  Jacob Love  has presented today for surgery, with the diagnosis of osteomyelitis right foot, neuropathic ulceration with necrosis of bone.  The various methods of treatment have been discussed with the patient.  No guarantees were given.  After consideration of risks, benefits and other options for treatment, the patient has consented to surgery.  I have reviewed the patients' chart and labs.    PROCEDURE:  RIGHT PARTIAL 1ST RAY AMPUTATION WITH ROTATIONAL SKIN FLAP CLOSURE   A history and physical examination was performed in the hospital.  The patient was reexamined.  There have been no changes to this history and physical examination.  Caroline More, DPM

## 2022-08-27 ENCOUNTER — Encounter: Payer: Self-pay | Admitting: Podiatry

## 2022-08-27 DIAGNOSIS — E871 Hypo-osmolality and hyponatremia: Secondary | ICD-10-CM | POA: Insufficient documentation

## 2022-08-27 DIAGNOSIS — M86071 Acute hematogenous osteomyelitis, right ankle and foot: Secondary | ICD-10-CM

## 2022-08-27 DIAGNOSIS — M869 Osteomyelitis, unspecified: Secondary | ICD-10-CM | POA: Diagnosis present

## 2022-08-27 DIAGNOSIS — E876 Hypokalemia: Secondary | ICD-10-CM | POA: Insufficient documentation

## 2022-08-27 LAB — CBC
HCT: 37.3 % — ABNORMAL LOW (ref 39.0–52.0)
Hemoglobin: 11.6 g/dL — ABNORMAL LOW (ref 13.0–17.0)
MCH: 27.3 pg (ref 26.0–34.0)
MCHC: 31.1 g/dL (ref 30.0–36.0)
MCV: 87.8 fL (ref 80.0–100.0)
Platelets: 356 10*3/uL (ref 150–400)
RBC: 4.25 MIL/uL (ref 4.22–5.81)
RDW: 13.7 % (ref 11.5–15.5)
WBC: 8.2 10*3/uL (ref 4.0–10.5)
nRBC: 0 % (ref 0.0–0.2)

## 2022-08-27 LAB — BASIC METABOLIC PANEL
Anion gap: 9 (ref 5–15)
BUN: 11 mg/dL (ref 6–20)
CO2: 28 mmol/L (ref 22–32)
Calcium: 8.2 mg/dL — ABNORMAL LOW (ref 8.9–10.3)
Chloride: 100 mmol/L (ref 98–111)
Creatinine, Ser: 0.86 mg/dL (ref 0.61–1.24)
GFR, Estimated: >60 mL/min (ref >60)
Glucose, Bld: 150 mg/dL — ABNORMAL HIGH (ref 70–99)
Potassium: 2.9 mmol/L — ABNORMAL LOW (ref 3.5–5.1)
Sodium: 137 mmol/L (ref 135–145)

## 2022-08-27 MED ORDER — HYDROCODONE-ACETAMINOPHEN 5-325 MG PO TABS: 1.0000 | ORAL_TABLET | Freq: Four times a day (QID) | ORAL | 0 refills | Status: AC | PRN

## 2022-08-27 MED ORDER — POTASSIUM CHLORIDE CRYS ER 20 MEQ PO TBCR
40.0000 meq | EXTENDED_RELEASE_TABLET | ORAL | Status: AC
  Administered 2022-08-27 (×2): 40 meq via ORAL
  Filled 2022-08-27 (×2): qty 2

## 2022-08-27 MED ORDER — AMOXICILLIN-POT CLAVULANATE 875-125 MG PO TABS: 1.0000 | ORAL_TABLET | Freq: Two times a day (BID) | ORAL | 0 refills | Status: AC

## 2022-08-27 MED ORDER — SULFAMETHOXAZOLE-TRIMETHOPRIM 800-160 MG PO TABS: 1.0000 | ORAL_TABLET | Freq: Two times a day (BID) | ORAL | 0 refills | Status: AC

## 2022-08-27 NOTE — Progress Notes (Signed)
Met with the patient in the room He lives at home with his wife and adult sons, his mom will provide transportation home since his wife is out of town at the moment He will get a cane from [employer] prior to going home He has an appointment for Dr for follow up next week He is a patient in [place] at [facility] for PCP when needed Encouraged him to apply for medicaid and provided him the website to find the application on line No additional needs at this time

## 2022-08-27 NOTE — Plan of Care (Signed)
  Problem: Fluid Volume: Goal: Hemodynamic stability will improve Outcome: Progressing   Problem: Clinical Measurements: Goal: Diagnostic test results will improve Outcome: Progressing Goal: Signs and symptoms of infection will decrease Outcome: Progressing   Problem: Respiratory: Goal: Ability to maintain adequate ventilation will improve Outcome: Progressing   Problem: Education: Goal: Knowledge of General Education information will improve Description: Including pain rating scale, medication(s)/side effects and non-pharmacologic comfort measures Outcome: Progressing   Problem: Health Behavior/Discharge Planning: Goal: Ability to manage health-related needs will improve Outcome: Progressing   Problem: Clinical Measurements: Goal: Ability to maintain clinical measurements within normal limits will improve Outcome: Progressing Goal: Will remain free from infection Outcome: Progressing Goal: Diagnostic test results will improve Outcome: Progressing Goal: Respiratory complications will improve Outcome: Progressing Goal: Cardiovascular complication will be avoided Outcome: Progressing   Problem: Nutrition: Goal: Adequate nutrition will be maintained Outcome: Progressing   Problem: Coping: Goal: Level of anxiety will decrease Outcome: Progressing   Problem: Elimination: Goal: Will not experience complications related to bowel motility Outcome: Progressing Goal: Will not experience complications related to urinary retention Outcome: Progressing   Problem: Pain Managment: Goal: General experience of comfort will improve Outcome: Progressing   Problem: Safety: Goal: Ability to remain free from injury will improve Outcome: Progressing   Problem: Skin Integrity: Goal: Risk for impaired skin integrity will decrease Outcome: Progressing

## 2022-08-27 NOTE — Discharge Instructions (Signed)
Podiatry (Foot and Ankle) discharge instructions: 1.  Keep surgical dressings to your right foot clean, dry, and intact until your postoperative appointment in clinic.  If the dressings become wet or saturated or loosened, please contact our office for instructions.  If able to change the dressing then do so by removing the dressing, apply nonadherent gauze to the incision site, 4 x 4 gauze, roll gauze, Ace wrap.  Again only change the dressing if it becomes saturated or loosened, please leave the dressing on until your appointment if possible. 2.  Ambulate as minimally as possible on your right foot in a surgical shoe at all times with heel contact only.  Try to limit weightbearing to around the house activities.  The more able to stay off of your foot the more likely the incision is to heal.  Do not recommend using it right foot to drive as we do not want any pressure to be put through the ball of your foot. 3.  Please take antibiotics that were prescribed until gone. 4.  Make an appointment for 1 week after discharge date for further evaluation of your incision and care.

## 2022-08-27 NOTE — Evaluation (Signed)
Physical Therapy Evaluation Patient Details Name: Jacob Love MRN: [id number] DOB: [birth year] Today's Date: 08/27/2022  History of Present Illness  Jacob Love is an 41 y.o. male  seen in ed for right foot ulcer.   He has had it for several months and recently has been draining and is malodorous.   Patient has been seen by podiatry in the past and has had a similar wound on his left foot that is healed.  Patient reports that his foot has deformity because of which he puts pressure on certain areas and ends up getting calluses that resulted into wounds.  Patient states that he has been having some fevers and chills over the past 2 days.  Has no known history of diabetes however chart review shows that his glucose has been elevated and suspect underlying diabetes mellitus type 2 secondary to obesity. the patient has consented to surgery.  Pt now S/P . 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 PWB with WB on heel for short distance.  Clinical Impression  Pt receive din bed agreeable ot PT Interventions. Pt is full time employee at [employer] and is tying to return to light duty job. PLOF pt is Ind and driving at community level activity participation. Pt reported of having high pain tolerance and have neglected his foot. Today's assessment revealed  impaired light touch sensation in BLE. Ind with bed mobility. Ambulated with toe off loading shoe with sup and IV pole 40 ft and ascended and descended 3 steps with sup and mac VC for safety and sequencing. Pt is impulsive but very pleasant and cooperative. PT will continue  to improve safety and independence with HEP. Pt will benefit form Out -pt PT after acute care to promote healing and advance pt safely with WB based on MD advise.      Recommendations for follow up therapy are one component of a multi-disciplinary discharge planning process, led by the attending physician.  Recommendations may be updated based on  patient status, additional functional criteria and insurance authorization.  Follow Up Recommendations Outpatient PT      Assistance Recommended at Discharge PRN  Patient can return home with the following  Direct supervision/assist for medications management;Help with stairs or ramp for entrance    Equipment Recommendations Cane  Recommendations for Other Services       Functional Status Assessment Patient has had a recent decline in their functional status and demonstrates the ability to make significant improvements in function in a reasonable and predictable amount of time.     Precautions / Restrictions Precautions Precautions: Fall Restrictions Weight Bearing Restrictions: Yes Other Position/Activity Restrictions: PWB to LLE with toe off loading shoe.      Mobility  Bed Mobility Overal bed mobility: Independent                  Transfers Overall transfer level: Needs assistance Equipment used: None (IV Pole) Transfers: Sit to/from Stand Sit to Stand: Supervision           General transfer comment:  (sup due to impulsivity.)    Ambulation/Gait Ambulation/Gait assistance: Supervision Gait Distance (Feet): 40 Feet Assistive device: IV Pole Gait Pattern/deviations: Step-to pattern Gait velocity: dec     General Gait Details: with toe off loading shoe  Stairs Stairs: Yes Stairs assistance: Supervision Stair Management: Two rails, Step to pattern Number of Stairs: 3 General stair comments: VC for sequencing and to safety awarenss.  Wheelchair Mobility  Modified Rankin (Stroke Patients Only)       Balance Overall balance assessment: Needs assistance Sitting-balance support: Feet unsupported, No upper extremity supported Sitting balance-Leahy Scale: Normal     Standing balance support: Single extremity supported Standing balance-Leahy Scale: Good Standing balance comment: needs Sup for safety                              Pertinent Vitals/Pain Pain Assessment Pain Assessment: No/denies pain    Home Living Family/patient expects to be discharged to:: Private residence Living Arrangements: Spouse/significant other;Children Available Help at Discharge: Available 24 hours/day;Family Type of Home: House Home Access: Stairs to enter Entrance Stairs-Rails: Can reach both Entrance Stairs-Number of Steps: 5   Home Layout: Two level Home Equipment: None      Prior Function Prior Level of Function : Independent/Modified Independent             Mobility Comments: Pt works full time, drives and Ind at [employer] level activity participation ADLs Comments: Independent with ADLs and IADLs.     Hand Dominance   Dominant Hand: Right    Extremity/Trunk Assessment   Upper Extremity Assessment Upper Extremity Assessment: Overall WFL for tasks assessed    Lower Extremity Assessment Lower Extremity Assessment: LLE deficits/detail LLE: Unable to fully assess due to immobilization (S/P MTP amputation) LLE Sensation: decreased light touch LLE Coordination: WNL       Communication   Communication: No difficulties  Cognition Arousal/Alertness: Awake/alert Behavior During Therapy: WFL for tasks assessed/performed, Impulsive Overall Cognitive Status: Within Functional Limits for tasks assessed                                          General Comments General comments (skin integrity, edema, etc.): decreased 2/2 surgical incisions.    Exercises     Assessment/Plan    PT Assessment Patient needs continued PT services  PT Problem List Decreased strength;Decreased balance;Decreased safety awareness;Obesity;Impaired sensation       PT Treatment Interventions Gait training;Stair training;Balance training;Patient/family education    PT Goals (Current goals can be found in the Care Plan section)  Acute Rehab PT Goals Patient Stated Goal: " I need to get back to working light  duty." PT Goal Formulation: With patient Time For Goal Achievement: 09/10/22 Potential to Achieve Goals: Good Additional Goals Additional Goal #1: Pt will demonstrate indepdence with HEP in order to promote safe healing and imporved safety awareness 100% of the time.    Frequency .7X/week     Co-evaluation               AM-PAC PT "6 Clicks" Mobility  Outcome Measure Help needed turning from your back to your side while in a flat bed without using bedrails?: None Help needed moving from lying on your back to sitting on the side of a flat bed without using bedrails?: None Help needed moving to and from a bed to a chair (including a wheelchair)?: None Help needed standing up from a chair using your arms (e.g., wheelchair or bedside chair)?: None Help needed to walk in hospital room?: A Little Help needed climbing 3-5 steps with a railing? : A Little 6 Click Score: 22    End of Session Equipment Utilized During Treatment: Gait belt Activity Tolerance: Patient tolerated treatment well Patient left: in bed;with call bell/phone within reach  Nurse Communication: Mobility status;Precautions;Weight bearing status PT Visit Diagnosis: Unsteadiness on feet (R26.81);Other abnormalities of gait and mobility (R26.89)    Time: 4327-6147 PT Time Calculation (min) (ACUTE ONLY): 35 min   Charges:   PT Evaluation $PT Eval Low Complexity: 1 Low PT Treatments $Gait Training: 8-22 mins $Therapeutic Activity: 8-22 mins    Meklit Cotta PT DPT 11:46 AM,08/27/22

## 2022-08-27 NOTE — Progress Notes (Signed)
PODIATRY / FOOT AND ANKLE SURGERY PROGRESS NOTE  Reason for consult: R foot infection/wound  Chief Complaint: Right foot wound/infection   HPI: Jacob Love is a 41 y.o. male who presents status post 1 day right partial first ray amputation with bone biopsy second metatarsal and bone culture.  Patient has kept dressings clean, dry, and intact since procedure.  Patient has been noted to be walking without the surgical shoe Love but has been putting pressure Love his heel he says.  Patient does have a heel wedge shoe Love the counter that he should be using.  Patient currently denies nausea, vomiting, fevers, chills.  PMHx:  Past Medical History:  Diagnosis Date   Hypertension    Meniere disease    Morbid obesity (HCC)     Surgical Hx:  Past Surgical History:  Procedure Laterality Date   AMPUTATION Right 08/26/2022   Procedure: RIGHT PARTIAL 1ST RAY AMPUTATION;  Surgeon: Rosetta Posner, DPM;  Location: [facility] ORS;  Service: Podiatry;  Laterality: Right;   NO PAST SURGERIES      FHx: History reviewed. No pertinent family history.  Social History:  reports that he has been smoking cigarettes. He has been smoking an average of 1 pack per day. His smokeless tobacco use includes snuff. He reports current alcohol use. He reports that he does not use drugs.  Allergies: No Known Allergies  Medications Prior to Admission  Medication Sig Dispense Refill   aspirin EC 81 MG tablet Take 81 mg by mouth daily. (Patient not taking: Reported Love 08/24/2022)     hydrochlorothiazide (HYDRODIURIL) 25 MG tablet Take 25 mg by mouth daily. (Patient not taking: Reported Love 08/24/2022)  3    Physical Exam: General: Alert and oriented.  No apparent distress.  Vascular: DP/PT pulses palpable bilateral, capillary fill time intact to digits bilaterally.  Hair growth present to digits and foot bilaterally.  Neuro: Light touch sensation appears to be reduced to bilateral lower extremities.  Derm: Amputation site  at the partial first ray area appears to be well coapted with sutures intact, minimal serosanguineous drainage, no odor, mild edema and erythema consistent with postoperative course, appears to be improved compared to preop.    MSK: Right partial first ray amputation  Results for orders placed or performed during the hospital encounter of 08/24/22 (from the past 48 hour(s))  Surgical pcr screen     Status: None   Collection Time: 08/25/22  2:40 PM   Specimen: Nasal Mucosa; Nasal Swab  Result Value Ref Range   MRSA, PCR NEGATIVE NEGATIVE   Staphylococcus aureus NEGATIVE NEGATIVE    Comment: (NOTE) The Xpert SA Assay (FDA approved for NASAL specimens in patients 11 years of age and older), is one component of a comprehensive surveillance program. It is not intended to diagnose infection nor to guide or monitor treatment. Performed at [facility], [address] [place], [place] [address]   Hemoglobin A1c     Status: Abnormal   Collection Time: 08/26/22  6:03 AM  Result Value Ref Range   Hgb A1c MFr Bld 5.9 (H) 4.8 - 5.6 %    Comment: (NOTE) Pre diabetes:          5.7%-6.4%  Diabetes:              >6.4%  Glycemic control for   <7.0% adults with diabetes    Mean Plasma Glucose 122.63 mg/dL    Comment: Performed at [facility], [address],  [place], [place] [address]  CBC     Status: Abnormal   Collection Time: 08/26/22  6:03 AM  Result Value Ref Range   WBC 7.2 4.0 - 10.5 K/uL   RBC 4.35 4.22 - 5.81 MIL/uL   Hemoglobin 11.9 (L) 13.0 - 17.0 g/dL   HCT 37.4 (L) 39.0 - 52.0 %   MCV 86.0 80.0 - 100.0 fL   MCH 27.4 26.0 - 34.0 pg   MCHC 31.8 30.0 - 36.0 g/dL   RDW 13.8 11.5 - 15.5 %   Platelets 351 150 - 400 K/uL   nRBC 0.0 0.0 - 0.2 %    Comment: Performed at [facility], [address], [place], [place] [address]  Basic metabolic panel     Status: Abnormal   Collection Time: 08/26/22  6:03 AM  Result Value Ref Range   Sodium 134 (L) 135 -  145 mmol/L   Potassium 3.1 (L) 3.5 - 5.1 mmol/L   Chloride 98 98 - 111 mmol/L   CO2 30 22 - 32 mmol/L   Glucose, Bld 107 (H) 70 - 99 mg/dL    Comment: Glucose reference range applies only to samples taken after fasting for at least 8 hours.   BUN 10 6 - 20 mg/dL   Creatinine, Ser 0.86 0.61 - 1.24 mg/dL   Calcium 8.4 (L) 8.9 - 10.3 mg/dL   GFR, Estimated >60 >60 mL/min    Comment: (NOTE) Calculated using the CKD-EPI Creatinine Equation (2021)    Anion gap 6 5 - 15    Comment: Performed at [facility], [address] [place], [place] [address]  Magnesium     Status: None   Collection Time: 08/26/22  6:03 AM  Result Value Ref Range   Magnesium 2.0 1.7 - 2.4 mg/dL    Comment: Performed at [facility], [address] [place], [place] [address]  Aerobic/Anaerobic Culture w Gram Stain (surgical/deep wound)     Status: None (Preliminary result)   Collection Time: 08/26/22  4:37 PM   Specimen: PATH Other; Tissue  Result Value Ref Range   Specimen Description      WOUND TISSUE Performed at [facility], [address] [place], [place] [address]    Special Requests      NONE Performed at [facility], [address] [place], [place] [address]    Gram Stain      NO WBC SEEN NO ORGANISMS SEEN Performed at [facility], [place] [address], [place], [place] [address]    Culture PENDING    Report Status PENDING   CBC     Status: Abnormal   Collection Time: 08/27/22  3:51 AM  Result Value Ref Range   WBC 8.2 4.0 - 10.5 K/uL   RBC 4.25 4.22 - 5.81 MIL/uL   Hemoglobin 11.6 (L) 13.0 - 17.0 g/dL   HCT 37.3 (L) 39.0 - 52.0 %   MCV 87.8 80.0 - 100.0 fL   MCH 27.3 26.0 - 34.0 pg   MCHC 31.1 30.0 - 36.0 g/dL   RDW 13.7 11.5 - 15.5 %   Platelets 356 150 - 400 K/uL   nRBC 0.0 0.0 - 0.2 %    Comment: Performed at [facility], [address], [place], [place] [address]  Basic metabolic panel     Status: Abnormal   Collection Time:  08/27/22  3:51 AM  Result Value Ref Range   Sodium 137 135 - 145 mmol/L   Potassium  2.9 (L) 3.5 - 5.1 mmol/L   Chloride 100 98 - 111 mmol/L   CO2 28 22 - 32 mmol/L   Glucose, Bld 150 (H) 70 - 99 mg/dL    Comment: Glucose reference range applies only to samples taken after fasting for at least 8 hours.   BUN 11 6 - 20 mg/dL   Creatinine, Ser 5.46 0.61 - 1.24 mg/dL   Calcium 8.2 (L) 8.9 - 10.3 mg/dL   GFR, Estimated >50 >35 mL/min    Comment: (NOTE) Calculated using the CKD-EPI Creatinine Equation (2021)    Anion gap 9 5 - 15    Comment: Performed at [facility], [address] [place], [place] [address]   DG Foot Complete Right  Result Date: 08/26/2022 CLINICAL DATA:  Osteomyelitis, postsurgical straight EXAM: RIGHT FOOT COMPLETE - 3+ VIEW COMPARISON:  08/24/2022 FINDINGS: There is interval surgical removal of right big toe and distal portion of right first metatarsal. There are pockets of air in the soft tissues from recent surgery. No recent fracture or dislocation is seen. No focal lytic lesions are seen. Plantar spur is seen in calcaneus. Small bony spurs are noted in the dorsal aspect of intertarsal joints. IMPRESSION: Interval surgical removal of right big toe and distal half of right first metatarsal. No fracture is seen. There are no focal lytic lesions. Electronically Signed   By: Ernie Avena M.D.   Love: 08/26/2022 19:13    Blood pressure 108/72, pulse 69, temperature 98.8 F (37.1 C), resp. rate 17, height 6\' 3"  (1.905 m), weight (!) 163.7 kg, SpO2 97 %.  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 -Postoperative x-ray imaging reviewed and discussed with patient in detail. -Incision site appears to be well coapted with sutures intact, no active drainage.  Redressed today with Xeroform to the incision line  followed by 4 x 4 gauze, ABD, Kerlix, Ace wrap. -Patient instructed Love partial weightbearing with heel contact and wedge heel shoe.  Patient should not be putting any pressure Love the ball of the foot.  Discussed with patient that he should only be up and Love his foot for short distances and should not be up for long periods of time.  Do not recommend that the patient drives as well until the incisional area heals which will likely be 2 to 3 weeks if everything goes well. -Appreciate medicine recommendations for antibiotic therapy.  So far culture growing strep group G.  Intraoperative culture of second metatarsal with no growth currently.  Pathology pending from procedure.  If patient is to be discharged without sensitivities then would recommend broad-spectrum antibiotics including Augmentin and Bactrim for 7-day course. -Patient to keep dressings clean, dry, and intact until postoperative visit.  Podiatry team to sign off at this time.  When/if deemed medically stable from medicine perspective could be discharged.  Patient instructed to follow-up in outpatient clinic in 1 week.  , DPM 08/27/2022, 11:08 AM

## 2022-08-27 NOTE — Plan of Care (Signed)
Patient discharged per MD orders at this time.All dc instructions, education and medications reviewed with the patient.Pt expressed understanding and will comply with dc instructions.follow up appointments was also communicated to the patient.no verbal c/o or any ssx of distress.Pt was discharged home with HH/PT services per order.Pt was transported home by mom in a privately owned vehicle.

## 2022-08-27 NOTE — Discharge Summary (Addendum)
Physician Discharge Summary  Jacob Love:[id number] DOB: [birth year] DOA: 08/24/2022  PCP: [facility]  Admit date: 08/24/2022 Discharge date: 08/27/2022  Admitted From: Home Disposition:  Home   Recommendations for Outpatient Follow-up:  Follow-up with PCP in 1 week, recheck BMP to check for hypokalemia.  Potassium was replaced prior to discharge Follow up with podiatry in 1 week with Dr. Luana Shu, follow-up pathology outpatient  Discharge Condition: Stable CODE STATUS: Full code  Diet recommendation: Heart healthy diet  Brief/Interim Summary: Jacob Love is a 41 y.o. male with past medical history significant for essential hypertension, morbid obesity, hallux valgus who presented to [facility] ED on 10/30 with right great toe ulceration.  Patient reports ongoing for several weeks and now has become malodorous, draining, with surrounding erythema.  Patient reports additionally with fever/chills and feeling unwell.  Has been followed in the past by podiatry outpatient.  Denies headache, no chest pain, no palpitations, no shortness of breath, no abdominal pain.   In the ED, temperature 103.0 F, HR 108, RR 18, BP 143/80, SPO2 88% on room air.  Sodium 130, potassium 2.7, chloride 91, CO2 27, glucose 129, BUN 10, creatinine 1.05.  AST 33, ALT 19, total bilirubin 0.9.  WBC count 20.8, hemoglobin 13.8, platelets 42.  Urinalysis unrevealing.  Chest x-ray with subsegmental atelectasis left base, otherwise unrevealing.  Right foot x-ray with soft tissue defect right great toe with underlying bone lucency in the proximal phalanx of the right great toe concerning for osteomyelitis.  Patient was started on empiric antibiotics, receive IV fluid bolus.  Hospitalist service consulted for admission for further evaluation and management of right great toe osteomyelitis with underlying sepsis.  Podiatry was consulted and patient underwent right partial first ray  amputation, right second metatarsal head bone biopsy with Dr. Luana Shu on 11/1.  He was discharged on Augmentin and Bactrim and to follow-up closely with podiatry outpatient.  Discharge Diagnoses:   Principal Problem:   Osteomyelitis ([place]) Active Problems:   Essential hypertension   Severe sepsis (HCC)   Morbid obesity with BMI of 45.0-49.9, adult (HCC)   Smoker   Hyponatremia   Hypokalemia Severe sepsis present on admission   Discharge Instructions  Discharge Instructions     Call MD for:  difficulty breathing, headache or visual disturbances   Complete by: As directed    Call MD for:  extreme fatigue   Complete by: As directed    Call MD for:  persistant dizziness or light-headedness   Complete by: As directed    Call MD for:  persistant nausea and vomiting   Complete by: As directed    Call MD for:  redness, tenderness, or signs of infection (pain, swelling, redness, odor or green/yellow discharge around incision site)   Complete by: As directed    Call MD for:  severe uncontrolled pain   Complete by: As directed    Call MD for:  temperature >100.4   Complete by: As directed    Diet - low sodium heart healthy   Complete by: As directed    Discharge instructions   Complete by: As directed    You were cared for by a hospitalist during your hospital stay. If you have any questions about your discharge medications or the care you received while you were in the hospital after you are discharged, you can call the unit and ask to speak with the hospitalist on call if the hospitalist that took care of you  is not available. Once you are discharged, your primary care physician will handle any further medical issues. Please note that NO REFILLS for any discharge medications will be authorized once you are discharged, as it is imperative that you return to your primary care physician (or establish a relationship with a primary care physician if you do not have one) for your aftercare needs  so that they can reassess your need for medications and monitor your lab values.   Increase activity slowly   Complete by: As directed    Leave dressing on - Keep it clean, dry, and intact until clinic visit   Complete by: As directed       Allergies as of 08/27/2022   No Known Allergies      Medication List     STOP taking these medications    aspirin EC 81 MG tablet   hydrochlorothiazide 25 MG tablet Commonly known as: HYDRODIURIL       TAKE these medications    amoxicillin-clavulanate 875-125 MG tablet Commonly known as: AUGMENTIN Take 1 tablet by mouth 2 (two) times daily for 7 days.   HYDROcodone-acetaminophen 5-325 MG tablet Commonly known as: NORCO/VICODIN Take 1-2 tablets by mouth every 6 (six) hours as needed for severe pain.   sulfamethoxazole-trimethoprim 800-160 MG tablet Commonly known as: BACTRIM DS Take 1 tablet by mouth 2 (two) times daily for 7 days.               Discharge Care Instructions  (From admission, onward)           Start     Ordered   08/27/22 0000  Leave dressing on - Keep it clean, dry, and intact until clinic visit        08/27/22 1131            Follow-up Information     Caroline More, DPM. Schedule an appointment as soon as possible for a visit in 1 week(s).   Specialty: Podiatry Why: For wound re-check Contact information: [place] [address] [phone]         [facility] Follow up.   Why: Recheck BMP to check potassium level Contact information: [place] [address] [phone]                No Known Allergies  Consultations: Podiatry    Procedures/Studies: DG Foot Complete Right  Result Date: 08/26/2022 CLINICAL DATA:  Osteomyelitis, postsurgical straight EXAM: RIGHT FOOT COMPLETE - 3+ VIEW COMPARISON:  08/24/2022 FINDINGS: There is interval surgical removal of right big toe and distal portion of right first metatarsal.  There are pockets of air in the soft tissues from recent surgery. No recent fracture or dislocation is seen. No focal lytic lesions are seen. Plantar spur is seen in calcaneus. Small bony spurs are noted in the dorsal aspect of intertarsal joints. IMPRESSION: Interval surgical removal of right big toe and distal half of right first metatarsal. No fracture is seen. There are no focal lytic lesions. Electronically Signed   By: Elmer Picker M.D.   On: 08/26/2022 19:13   DG Foot 2 Views Right  Result Date: 08/24/2022 CLINICAL DATA:  Open wound right big toe EXAM: RIGHT FOOT - 2 VIEW COMPARISON:  08/20/2018 FINDINGS: Soft tissue defect in the right great toe. Underlying bone destruction within the great toe proximal phalanx concerning for osteomyelitis. Moderate degenerative changes at the 1st MTP joint with hallux valgus deformity. No fracture. IMPRESSION:  Soft tissue defect in the right great toe with underlying bone lucency in the proximal phalanx of the right great toe concerning for osteomyelitis. Electronically Signed   By: Rolm Baptise M.D.   On: 08/24/2022 19:29   DG Chest 1 View  Result Date: 08/24/2022 CLINICAL DATA:  Sepsis. EXAM: CHEST  1 VIEW COMPARISON:  None Available. FINDINGS: The cardiomediastinal contours are normal. Upper normal heart size likely accentuated by AP technique. Subsegmental atelectasis the left lung base. Pulmonary vasculature is normal. No consolidation, pleural effusion, or pneumothorax. No acute osseous abnormalities are seen. IMPRESSION: Subsegmental atelectasis at the left lung base. Electronically Signed   By: Keith Rake M.D.   On: 08/24/2022 19:29       Discharge Exam: Vitals:   08/27/22 0503 08/27/22 0732  BP: 99/63 108/72  Pulse: 77 69  Resp: 17 17  Temp: 98.3 F (36.8 C) 98.8 F (37.1 C)  SpO2: 100% 97%    General: Pt is alert, awake, not in acute distress Cardiovascular: RRR, S1/S2 +, no edema Respiratory: CTA bilaterally, no wheezing,  no rhonchi, no respiratory distress, no conversational dyspnea  Abdominal: Soft, NT, ND, bowel sounds + Extremities: no edema, no cyanosis, right foot in dressing  Psych: Normal mood and affect, stable judgement and insight     The results of significant diagnostics from this hospitalization (including imaging, microbiology, ancillary and laboratory) are listed below for reference.     Microbiology: Recent Results (from the past 240 hour(s))  Culture, blood (Routine x 2)     Status: None (Preliminary result)   Collection Time: 08/24/22  6:52 PM   Specimen: BLOOD  Result Value Ref Range Status   Specimen Description BLOOD RIGHT ANTECUBITAL  Final   Special Requests   Final    BOTTLES DRAWN AEROBIC AND ANAEROBIC Blood Culture adequate volume   Culture   Final    NO GROWTH 2 DAYS Performed at [facility], [address], [place], [place] [address]    Report Status PENDING  Incomplete  Culture, blood (Routine x 2)     Status: None (Preliminary result)   Collection Time: 08/24/22  6:52 PM   Specimen: BLOOD  Result Value Ref Range Status   Specimen Description BLOOD LEFT ANTECUBITAL  Final   Special Requests   Final    BOTTLES DRAWN AEROBIC AND ANAEROBIC Blood Culture adequate volume   Culture   Final    NO GROWTH 2 DAYS Performed at [facility], [address], [place], [place] [address]    Report Status PENDING  Incomplete  Aerobic/Anaerobic Culture w Gram Stain (surgical/deep wound)     Status: None (Preliminary result)   Collection Time: 08/24/22  8:03 PM   Specimen: Wound  Result Value Ref Range Status   Specimen Description   Final    WOUND Performed at [facility], [address], [place], [place] [address]    Special Requests   Final    RT FOOT Performed at [facility], [address] [place], [place] [address]    Gram Stain   Final    FEW WBC PRESENT,BOTH PMN AND MONONUCLEAR MODERATE GRAM POSITIVE COCCI IN PAIRS AND  CHAINS FEW GRAM NEGATIVE RODS    Culture   Final    CULTURE REINCUBATED FOR BETTER GROWTH ABUNDANT STREPTOCOCCUS GROUP G Beta hemolytic streptococci are predictably susceptible to penicillin and other beta lactams. Susceptibility testing not routinely performed. Performed at [facility], [place],  [place] [address]    Report Status PENDING  Incomplete  Surgical pcr screen     Status: None   Collection Time: 08/25/22  2:40 PM   Specimen: Nasal Mucosa; Nasal Swab  Result Value Ref Range Status   MRSA, PCR NEGATIVE NEGATIVE Final   Staphylococcus aureus NEGATIVE NEGATIVE Final    Comment: (NOTE) The Xpert SA Assay (FDA approved for NASAL specimens in patients 77 years of age and older), is one component of a comprehensive surveillance program. It is not intended to diagnose infection nor to guide or monitor treatment. Performed at [facility], [address] [place], [place] [address]   Aerobic/Anaerobic Culture w Gram Stain (surgical/deep wound)     Status: None (Preliminary result)   Collection Time: 08/26/22  4:37 PM   Specimen: PATH Other; Tissue  Result Value Ref Range Status   Specimen Description   Final    WOUND TISSUE Performed at [facility], [address], [place], [place] [address]    Special Requests   Final    NONE Performed at [facility], [address] [place], [place] [address]    Gram Stain   Final    NO WBC SEEN NO ORGANISMS SEEN Performed at [facility], [place] [address], [place], [place] [address]    Culture PENDING  Incomplete   Report Status PENDING  Incomplete     Labs: BNP (last 3 results) No results for input(s): "BNP" in the last 8760 hours. Basic Metabolic Panel: Recent Labs  Lab 08/24/22 1852 08/24/22 1938 08/26/22 0603 08/27/22 0351  NA 130*  --  134* 137  K 2.7*  --  3.1* 2.9*  CL 91*  --  98 100  CO2 27  --  30 28  GLUCOSE 129*  --  107* 150*  BUN 10  --  10 11   CREATININE 1.05  --  0.86 0.86  CALCIUM 9.1  --  8.4* 8.2*  MG  --  1.9 2.0  --    Liver Function Tests: Recent Labs  Lab 08/24/22 1852  AST 33  ALT 19  ALKPHOS 101  BILITOT 0.9  PROT 8.7*  ALBUMIN 3.4*   No results for input(s): "LIPASE", "AMYLASE" in the last 168 hours. No results for input(s): "AMMONIA" in the last 168 hours. CBC: Recent Labs  Lab 08/24/22 1852 08/26/22 0603 08/27/22 0351  WBC 20.8* 7.2 8.2  NEUTROABS 17.2*  --   --   HGB 13.8 11.9* 11.6*  HCT 41.8 37.4* 37.3*  MCV 83.3 86.0 87.8  PLT 482* 351 356   Cardiac Enzymes: No results for input(s): "CKTOTAL", "CKMB", "CKMBINDEX", "TROPONINI" in the last 168 hours. BNP: Invalid input(s): "POCBNP" CBG: No results for input(s): "GLUCAP" in the last 168 hours. D-Dimer No results for input(s): "DDIMER" in the last 72 hours. Hgb A1c Recent Labs    08/26/22 0603  HGBA1C 5.9*   Lipid Profile No results for input(s): "CHOL", "HDL", "LDLCALC", "TRIG", "CHOLHDL", "LDLDIRECT" in the last 72 hours. Thyroid function studies No results for input(s): "TSH", "T4TOTAL", "T3FREE", "THYROIDAB" in the last 72 hours.  Invalid input(s): "FREET3" Anemia work up No results for input(s): "VITAMINB12", "FOLATE", "FERRITIN", "TIBC", "IRON", "RETICCTPCT" in the last 72 hours. Urinalysis    Component Value Date/Time   COLORURINE YELLOW (A) 08/24/2022 2321   APPEARANCEUR CLEAR (A) 08/24/2022 2321   LABSPEC 1.010 08/24/2022 2321   PHURINE 6.0 08/24/2022 2321   GLUCOSEU NEGATIVE 08/24/2022 2321   HGBUR NEGATIVE  08/24/2022 [place] 08/24/2022 2321   KETONESUR NEGATIVE 08/24/2022 2321   PROTEINUR NEGATIVE 08/24/2022 2321   NITRITE NEGATIVE 08/24/2022 [place] 08/24/2022 2321   Sepsis Labs Recent Labs  Lab 08/24/22 1852 08/26/22 0603 08/27/22 0351  WBC 20.8* 7.2 8.2   Microbiology Recent Results (from the past 240 hour(s))  Culture, blood (Routine x 2)     Status: None  (Preliminary result)   Collection Time: 08/24/22  6:52 PM   Specimen: BLOOD  Result Value Ref Range Status   Specimen Description BLOOD RIGHT ANTECUBITAL  Final   Special Requests   Final    BOTTLES DRAWN AEROBIC AND ANAEROBIC Blood Culture adequate volume   Culture   Final    NO GROWTH 2 DAYS Performed at [facility], [address], [place], [place] [address]    Report Status PENDING  Incomplete  Culture, blood (Routine x 2)     Status: None (Preliminary result)   Collection Time: 08/24/22  6:52 PM   Specimen: BLOOD  Result Value Ref Range Status   Specimen Description BLOOD LEFT ANTECUBITAL  Final   Special Requests   Final    BOTTLES DRAWN AEROBIC AND ANAEROBIC Blood Culture adequate volume   Culture   Final    NO GROWTH 2 DAYS Performed at [facility], [address] [place], [place] [address]    Report Status PENDING  Incomplete  Aerobic/Anaerobic Culture w Gram Stain (surgical/deep wound)     Status: None (Preliminary result)   Collection Time: 08/24/22  8:03 PM   Specimen: Wound  Result Value Ref Range Status   Specimen Description   Final    WOUND Performed at [facility], [address] [place], [place] [address]    Special Requests   Final    RT FOOT Performed at [facility], [address] [place], [place] [address]    Gram Stain   Final    FEW WBC PRESENT,BOTH PMN AND MONONUCLEAR MODERATE GRAM POSITIVE COCCI IN PAIRS AND CHAINS FEW GRAM NEGATIVE RODS    Culture   Final    CULTURE REINCUBATED FOR BETTER GROWTH ABUNDANT STREPTOCOCCUS GROUP G Beta hemolytic streptococci are predictably susceptible to penicillin and other beta lactams. Susceptibility testing not routinely performed. Performed at [facility], [place] [address] [place], [place] [address]    Report Status PENDING  Incomplete  Surgical pcr screen     Status: None   Collection Time: 08/25/22  2:40 PM   Specimen: Nasal Mucosa; Nasal Swab  Result  Value Ref Range Status   MRSA, PCR NEGATIVE NEGATIVE Final   Staphylococcus aureus NEGATIVE NEGATIVE Final    Comment: (NOTE) The Xpert SA Assay (FDA approved for NASAL specimens in patients 6 years of age and older), is one component of a comprehensive surveillance program. It is not intended to diagnose infection nor to guide or monitor treatment. Performed at [facility], [address] [place], [place] [address]   Aerobic/Anaerobic Culture w Gram Stain (surgical/deep wound)     Status: None (Preliminary result)   Collection Time: 08/26/22  4:37 PM   Specimen: PATH Other; Tissue  Result Value Ref Range Status   Specimen Description   Final    WOUND TISSUE Performed at [facility], [address] [place], [place] [address]    Special Requests   Final    NONE Performed at [facility], [place],  [place] [address]    Gram Stain   Final    NO WBC SEEN NO ORGANISMS SEEN Performed at [facility], [place] [address], [place], [place] [address]    Culture PENDING  Incomplete   Report Status PENDING  Incomplete     Patient was seen and examined on the day of discharge and was found to be in stable condition. Time coordinating discharge: 45 minutes including assessment and coordination of care, as well as examination of the patient.   SIGNED:  Dessa Phi, DO [facility] 08/27/2022, 11:32 AM

## 2022-08-28 LAB — CULTURE, BLOOD (ROUTINE X 2): Special Requests: ADEQUATE

## 2022-08-28 LAB — SURGICAL PATHOLOGY

## 2022-08-28 LAB — AEROBIC/ANAEROBIC CULTURE W GRAM STAIN (SURGICAL/DEEP WOUND)

## 2022-08-29 LAB — CULTURE, BLOOD (ROUTINE X 2)
Culture: NO GROWTH
Culture: NO GROWTH
Special Requests: ADEQUATE

## 2022-08-31 LAB — AEROBIC/ANAEROBIC CULTURE W GRAM STAIN (SURGICAL/DEEP WOUND): Gram Stain: NONE SEEN

## 2022-09-08 DIAGNOSIS — N481 Balanitis: Secondary | ICD-10-CM | POA: Diagnosis not present

## 2022-09-16 DIAGNOSIS — L97512 Non-pressure chronic ulcer of other part of right foot with fat layer exposed: Secondary | ICD-10-CM | POA: Diagnosis not present

## 2022-09-30 DIAGNOSIS — M2042 Other hammer toe(s) (acquired), left foot: Secondary | ICD-10-CM | POA: Diagnosis not present

## 2022-09-30 DIAGNOSIS — Z09 Encounter for follow-up examination after completed treatment for conditions other than malignant neoplasm: Secondary | ICD-10-CM | POA: Diagnosis not present

## 2022-09-30 DIAGNOSIS — G629 Polyneuropathy, unspecified: Secondary | ICD-10-CM | POA: Diagnosis not present

## 2022-09-30 DIAGNOSIS — M216X1 Other acquired deformities of right foot: Secondary | ICD-10-CM | POA: Diagnosis not present

## 2022-09-30 DIAGNOSIS — Z89421 Acquired absence of other right toe(s): Secondary | ICD-10-CM | POA: Diagnosis not present

## 2022-09-30 DIAGNOSIS — T8131XD Disruption of external operation (surgical) wound, not elsewhere classified, subsequent encounter: Secondary | ICD-10-CM | POA: Diagnosis not present

## 2022-09-30 DIAGNOSIS — Z6841 Body Mass Index (BMI) 40.0 and over, adult: Secondary | ICD-10-CM | POA: Diagnosis not present

## 2022-09-30 DIAGNOSIS — M2012 Hallux valgus (acquired), left foot: Secondary | ICD-10-CM | POA: Diagnosis not present

## 2022-09-30 DIAGNOSIS — L97512 Non-pressure chronic ulcer of other part of right foot with fat layer exposed: Secondary | ICD-10-CM | POA: Diagnosis not present

## 2022-09-30 DIAGNOSIS — M2041 Other hammer toe(s) (acquired), right foot: Secondary | ICD-10-CM | POA: Diagnosis not present

## 2022-09-30 DIAGNOSIS — M216X2 Other acquired deformities of left foot: Secondary | ICD-10-CM | POA: Diagnosis not present

## 2022-10-02 ENCOUNTER — Encounter: Payer: Self-pay | Admitting: Emergency Medicine

## 2022-10-02 ENCOUNTER — Other Ambulatory Visit: Payer: Self-pay

## 2022-10-02 ENCOUNTER — Inpatient Hospital Stay
Admission: EM | Admit: 2022-10-02 | Discharge: 2022-10-08 | DRG: 504 | Disposition: A | Discharge status: Home / Self Care | Payer: Medicaid Other | Attending: Internal Medicine | Admitting: Internal Medicine

## 2022-10-02 DIAGNOSIS — Z87891 Personal history of nicotine dependence: Secondary | ICD-10-CM | POA: Diagnosis not present

## 2022-10-02 DIAGNOSIS — T8149XA Infection following a procedure, other surgical site, initial encounter: Principal | ICD-10-CM

## 2022-10-02 DIAGNOSIS — G629 Polyneuropathy, unspecified: Secondary | ICD-10-CM | POA: Diagnosis present

## 2022-10-02 DIAGNOSIS — I10 Essential (primary) hypertension: Secondary | ICD-10-CM | POA: Diagnosis present

## 2022-10-02 DIAGNOSIS — F17210 Nicotine dependence, cigarettes, uncomplicated: Secondary | ICD-10-CM | POA: Diagnosis present

## 2022-10-02 DIAGNOSIS — E876 Hypokalemia: Secondary | ICD-10-CM | POA: Diagnosis present

## 2022-10-02 DIAGNOSIS — L02611 Cutaneous abscess of right foot: Secondary | ICD-10-CM | POA: Diagnosis present

## 2022-10-02 DIAGNOSIS — T502X5A Adverse effect of carbonic-anhydrase inhibitors, benzothiadiazides and other diuretics, initial encounter: Secondary | ICD-10-CM | POA: Diagnosis present

## 2022-10-02 DIAGNOSIS — R7303 Prediabetes: Secondary | ICD-10-CM | POA: Diagnosis present

## 2022-10-02 DIAGNOSIS — M86671 Other chronic osteomyelitis, right ankle and foot: Secondary | ICD-10-CM | POA: Diagnosis present

## 2022-10-02 DIAGNOSIS — M86171 Other acute osteomyelitis, right ankle and foot: Principal | ICD-10-CM | POA: Diagnosis present

## 2022-10-02 DIAGNOSIS — M869 Osteomyelitis, unspecified: Secondary | ICD-10-CM | POA: Diagnosis not present

## 2022-10-02 DIAGNOSIS — L97519 Non-pressure chronic ulcer of other part of right foot with unspecified severity: Secondary | ICD-10-CM | POA: Diagnosis present

## 2022-10-02 DIAGNOSIS — Z6841 Body Mass Index (BMI) 40.0 and over, adult: Secondary | ICD-10-CM

## 2022-10-02 LAB — CBC
HCT: 44.4 % (ref 39.0–52.0)
Hemoglobin: 14 g/dL (ref 13.0–17.0)
MCH: 27.3 pg (ref 26.0–34.0)
MCHC: 31.5 g/dL (ref 30.0–36.0)
MCV: 86.7 fL (ref 80.0–100.0)
Platelets: 293 10*3/uL (ref 150–400)
RBC: 5.12 MIL/uL (ref 4.22–5.81)
RDW: 15.3 % (ref 11.5–15.5)
WBC: 8.6 10*3/uL (ref 4.0–10.5)
nRBC: 0 % (ref 0.0–0.2)

## 2022-10-02 LAB — COMPREHENSIVE METABOLIC PANEL
ALT: 25 U/L (ref 0–44)
AST: 32 U/L (ref 15–41)
Albumin: 3.8 g/dL (ref 3.5–5.0)
Alkaline Phosphatase: 119 U/L (ref 38–126)
Anion gap: 9 (ref 5–15)
BUN: 10 mg/dL (ref 6–20)
CO2: 24 mmol/L (ref 22–32)
Calcium: 9.3 mg/dL (ref 8.9–10.3)
Chloride: 104 mmol/L (ref 98–111)
Creatinine, Ser: 0.75 mg/dL (ref 0.61–1.24)
GFR, Estimated: >60 mL/min (ref >60)
Glucose, Bld: 134 mg/dL — ABNORMAL HIGH (ref 70–99)
Potassium: 3.4 mmol/L — ABNORMAL LOW (ref 3.5–5.1)
Sodium: 137 mmol/L (ref 135–145)
Total Bilirubin: 0.8 mg/dL (ref 0.3–1.2)
Total Protein: 7.8 g/dL (ref 6.5–8.1)

## 2022-10-02 LAB — PREALBUMIN: Prealbumin: 23 mg/dL (ref 18–38)

## 2022-10-02 LAB — C-REACTIVE PROTEIN: CRP: 0.5 mg/dL (ref <1.0)

## 2022-10-02 LAB — MAGNESIUM: Magnesium: 2.3 mg/dL (ref 1.7–2.4)

## 2022-10-02 LAB — SEDIMENTATION RATE: Sed Rate: 18 mm/hr — ABNORMAL HIGH (ref 0–15)

## 2022-10-02 MED ORDER — ONDANSETRON HCL 4 MG PO TABS: 4.0000 mg | ORAL_TABLET | Freq: Four times a day (QID) | ORAL | Status: DC | PRN

## 2022-10-02 MED ORDER — SODIUM CHLORIDE 0.9 % IV SOLN
2.0000 g | INTRAVENOUS | Status: DC
  Administered 2022-10-02 – 2022-10-06 (×5): 2 g via INTRAVENOUS
  Filled 2022-10-02: qty 20
  Filled 2022-10-02 (×3): qty 2
  Filled 2022-10-02: qty 20

## 2022-10-02 MED ORDER — METRONIDAZOLE 500 MG/100ML IV SOLN
500.0000 mg | Freq: Three times a day (TID) | INTRAVENOUS | Status: DC
  Administered 2022-10-02 – 2022-10-06 (×14): 500 mg via INTRAVENOUS
  Filled 2022-10-02 (×15): qty 100

## 2022-10-02 MED ORDER — HYDROCHLOROTHIAZIDE 12.5 MG PO TABS
12.5000 mg | ORAL_TABLET | Freq: Every day | ORAL | Status: DC
  Administered 2022-10-04 – 2022-10-08 (×5): 12.5 mg via ORAL
  Filled 2022-10-02 (×6): qty 1

## 2022-10-02 MED ORDER — ONDANSETRON HCL 4 MG/2ML IJ SOLN: 4.0000 mg | Freq: Four times a day (QID) | INTRAMUSCULAR | Status: DC | PRN

## 2022-10-02 MED ORDER — HYDROCODONE-ACETAMINOPHEN 5-325 MG PO TABS: 1.0000 | ORAL_TABLET | Freq: Four times a day (QID) | ORAL | Status: DC | PRN

## 2022-10-02 MED ORDER — POTASSIUM CHLORIDE CRYS ER 20 MEQ PO TBCR
40.0000 meq | EXTENDED_RELEASE_TABLET | Freq: Once | ORAL | Status: AC
  Administered 2022-10-02: 40 meq via ORAL
  Filled 2022-10-02: qty 2

## 2022-10-02 MED ORDER — VANCOMYCIN HCL IN DEXTROSE 1-5 GM/200ML-% IV SOLN
1000.0000 mg | Freq: Once | INTRAVENOUS | Status: AC
  Administered 2022-10-02: 1000 mg via INTRAVENOUS
  Filled 2022-10-02: qty 200

## 2022-10-02 NOTE — Assessment & Plan Note (Signed)
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 Patient had wound dehiscence of his surgical wound and has received wound care as an outpatient Followed up with podiatry and had an x-ray which showed periosteal reaction and heterotopic bone formation around the area of the previous partial first ray amputation, new compared to x-rays that were performed around a month ago consistent with likely osteomyelitic changes.  Substantial deviation/dislocation of digits 2 and 3 at the metatarsal phalangeal joint and subluxation of the fourth toe at the metatarsal phalangeal joint.   Open ulceration present to the distal aspect of the first metatarsal with subcutaneous gas to this area, does not appear to be related to gaseous infection, more so air where the wound is.  Will place patient empirically on Rocephin and Flagyl IV Podiatry consult for revision of first ray amputation with closure

## 2022-10-02 NOTE — Assessment & Plan Note (Signed)
BMI 45.1 Complicates overall prognosis and care Lifestyle modification and exercise has been discussed with patient in detail

## 2022-10-02 NOTE — Assessment & Plan Note (Signed)
Continue hydrochlorothiazide. 

## 2022-10-02 NOTE — H&P (View-Only) (Signed)
Reason for Consult: Residual osteomyelitis right foot Referring Physician: [name] Jacob Love is an 41 y.o. male.  HPI: This is a 41 year old diabetic male with recent first ray resection on the right foot a little over a month ago.  Continues to have a chronic open wound at the surgical site and recently seen outpatient with the wound probing down to the level of the bone with x-ray changes consistent with continued osteomyelitis.  Decision was made for hospitalization and revision of his surgery.  Past Medical History:  Diagnosis Date   Hypertension    Meniere disease    Morbid obesity [facility])     Past Surgical History:  Procedure Laterality Date   AMPUTATION Right 08/26/2022   Procedure: RIGHT PARTIAL 1ST RAY AMPUTATION;  Surgeon: Rosetta Posner, DPM;  Location: [facility] ORS;  Service: Podiatry;  Laterality: Right;   NO PAST SURGERIES      History reviewed. No pertinent family history.  Social History:  reports that he has been smoking cigarettes. He has been smoking an average of 1 pack per day. His smokeless tobacco use includes snuff. He reports current alcohol use. He reports that he does not use drugs.  Allergies: No Known Allergies  Medications: Scheduled:  hydrochlorothiazide  12.5 mg Oral Daily   potassium chloride  40 mEq Oral Once    Results for orders placed or performed during the hospital encounter of 10/02/22 (from the past 48 hour(s))  CBC     Status: None   Collection Time: 10/02/22  8:52 AM  Result Value Ref Range   WBC 8.6 4.0 - 10.5 K/uL   RBC 5.12 4.22 - 5.81 MIL/uL   Hemoglobin 14.0 13.0 - 17.0 g/dL   HCT 78.4 69.6 - 29.5 %   MCV 86.7 80.0 - 100.0 fL   MCH 27.3 26.0 - 34.0 pg   MCHC 31.5 30.0 - 36.0 g/dL   RDW 28.4 13.2 - 44.0 %   Platelets 293 150 - 400 K/uL   nRBC 0.0 0.0 - 0.2 %    Comment: Performed at [facility], [address] [place], [place] [address]  Comprehensive metabolic panel     Status: Abnormal   Collection Time:  10/02/22  8:52 AM  Result Value Ref Range   Sodium 137 135 - 145 mmol/L   Potassium 3.4 (L) 3.5 - 5.1 mmol/L   Chloride 104 98 - 111 mmol/L   CO2 24 22 - 32 mmol/L   Glucose, Bld 134 (H) 70 - 99 mg/dL    Comment: Glucose reference range applies only to samples taken after fasting for at least 8 hours.   BUN 10 6 - 20 mg/dL   Creatinine, Ser 5.36 0.61 - 1.24 mg/dL   Calcium 9.3 8.9 - 64.4 mg/dL   Total Protein 7.8 6.5 - 8.1 g/dL   Albumin 3.8 3.5 - 5.0 g/dL   AST 32 15 - 41 U/L   ALT 25 0 - 44 U/L   Alkaline Phosphatase 119 38 - 126 U/L   Total Bilirubin 0.8 0.3 - 1.2 mg/dL   GFR, Estimated >03 >47 mL/min    Comment: (NOTE) Calculated using the CKD-EPI Creatinine Equation (2021)    Anion gap 9 5 - 15    Comment: Performed at [facility], [address] [place], [place] [address]    No results found.  Review of Systems  Constitutional:  Negative for chills and fever.  HENT:  Negative for sinus pain and sore throat.  Respiratory:  Negative for cough and shortness of breath.   Cardiovascular:  Negative for chest pain and palpitations.  Gastrointestinal:  Negative for nausea and vomiting.  Endocrine: Negative for polydipsia and polyuria.  Genitourinary:  Negative for frequency and urgency.  Musculoskeletal:        Previous partial first ray resection right foot.  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  Psychiatric/Behavioral:  Negative for confusion. The patient is not nervous/anxious.    Blood pressure (!) 155/96, pulse 89, temperature 98.3 F (36.8 C), resp. rate 20, height 6' 3" (1.905 m), weight (!) 163.7 kg, SpO2 96 %. Physical Exam Cardiovascular:     Comments: DP and PT pulses fully palpable. Musculoskeletal:     Comments: Adequate range of motion of the pedal joints.  Muscle testing deferred.  Previous first ray resection on the right  foot with laterally deviated hammertoes.  Skin:    Comments: The skin is warm dry and supple.  Some chronic edema and discoloration in the right foot with open draining wound with some mild malodor at the previous incision site.  Neurological:     Comments: Loss of sensation distally in the feet.     Assessment/Plan: Assessment: 1.  Continued osteomyelitis status post ray resection. 2.  Diabetes with neuropathy.  Plan: Discussed with the patient the need for revision of his surgery with debridement of some additional portion of the first metatarsal.  Discussed possible risks and complications of the procedure including but not limited to inability of the wound to heal due to his diabetes or continued infection.  No guarantees can be given as to the outcome of surgery.  Questions invited and answered.  We will obtain consent for incision and drainage with debridement of infected soft tissue and bone right foot.  Patient will be n.p.o. after midnight.  Plan is for surgery tomorrow morning.  Jacob Love Jacob Love 10/02/2022, 10:13 AM      

## 2022-10-02 NOTE — Consult Note (Signed)
Reason for Consult: Residual osteomyelitis right foot Referring Physician: Orlondo Holycross Jacob Love is an 41 y.o. male.  HPI: This is a 41 year old diabetic male with recent first ray resection on the right foot a little over a month ago.  Continues to have a chronic open wound at the surgical site and recently seen outpatient with the wound probing down to the level of the bone with x-ray changes consistent with continued osteomyelitis.  Decision was made for hospitalization and revision of his surgery.  Past Medical History:  Diagnosis Date   Hypertension    Meniere disease    Morbid obesity [facility])     Past Surgical History:  Procedure Laterality Date   AMPUTATION Right 08/26/2022   Procedure: RIGHT PARTIAL 1ST RAY AMPUTATION;  Surgeon: Rosetta Posner, DPM;  Location: [facility] ORS;  Service: Podiatry;  Laterality: Right;   NO PAST SURGERIES      History reviewed. No pertinent family history.  Social History:  reports that he has been smoking cigarettes. He has been smoking an average of 1 pack per day. His smokeless tobacco use includes snuff. He reports current alcohol use. He reports that he does not use drugs.  Allergies: No Known Allergies  Medications: Scheduled:  hydrochlorothiazide  12.5 mg Oral Daily   potassium chloride  40 mEq Oral Once    Results for orders placed or performed during the hospital encounter of 10/02/22 (from the past 48 hour(s))  CBC     Status: None   Collection Time: 10/02/22  8:52 AM  Result Value Ref Range   WBC 8.6 4.0 - 10.5 K/uL   RBC 5.12 4.22 - 5.81 MIL/uL   Hemoglobin 14.0 13.0 - 17.0 g/dL   HCT 78.4 69.6 - 29.5 %   MCV 86.7 80.0 - 100.0 fL   MCH 27.3 26.0 - 34.0 pg   MCHC 31.5 30.0 - 36.0 g/dL   RDW 28.4 13.2 - 44.0 %   Platelets 293 150 - 400 K/uL   nRBC 0.0 0.0 - 0.2 %    Comment: Performed at [facility], [address] [place], [place] [address]  Comprehensive metabolic panel     Status: Abnormal   Collection Time:  10/02/22  8:52 AM  Result Value Ref Range   Sodium 137 135 - 145 mmol/L   Potassium 3.4 (L) 3.5 - 5.1 mmol/L   Chloride 104 98 - 111 mmol/L   CO2 24 22 - 32 mmol/L   Glucose, Bld 134 (H) 70 - 99 mg/dL    Comment: Glucose reference range applies only to samples taken after fasting for at least 8 hours.   BUN 10 6 - 20 mg/dL   Creatinine, Ser 5.36 0.61 - 1.24 mg/dL   Calcium 9.3 8.9 - 64.4 mg/dL   Total Protein 7.8 6.5 - 8.1 g/dL   Albumin 3.8 3.5 - 5.0 g/dL   AST 32 15 - 41 U/L   ALT 25 0 - 44 U/L   Alkaline Phosphatase 119 38 - 126 U/L   Total Bilirubin 0.8 0.3 - 1.2 mg/dL   GFR, Estimated >03 >47 mL/min    Comment: (NOTE) Calculated using the CKD-EPI Creatinine Equation (2021)    Anion gap 9 5 - 15    Comment: Performed at [facility], [address] [place], [place] [address]    No results found.  Review of Systems  Constitutional:  Negative for chills and fever.  HENT:  Negative for sinus pain and sore throat.  Respiratory:  Negative for cough and shortness of breath.   Cardiovascular:  Negative for chest pain and palpitations.  Gastrointestinal:  Negative for nausea and vomiting.  Endocrine: Negative for polydipsia and polyuria.  Genitourinary:  Negative for frequency and urgency.  Musculoskeletal:        Previous partial first ray resection right foot.  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  Psychiatric/Behavioral:  Negative for confusion. The patient is not nervous/anxious.    Blood pressure (!) 155/96, pulse 89, temperature 98.3 F (36.8 C), resp. rate 20, height 6\' 3"  (1.905 m), weight (!) 163.7 kg, SpO2 96 %. Physical Exam Cardiovascular:     Comments: DP and PT pulses fully palpable. Musculoskeletal:     Comments: Adequate range of motion of the pedal joints.  Muscle testing deferred.  Previous first ray resection on the right  foot with laterally deviated hammertoes.  Skin:    Comments: The skin is warm dry and supple.  Some chronic edema and discoloration in the right foot with open draining wound with some mild malodor at the previous incision site.  Neurological:     Comments: Loss of sensation distally in the feet.     Assessment/Plan: Assessment: 1.  Continued osteomyelitis status post ray resection. 2.  Diabetes with neuropathy.  Plan: Discussed with the patient the need for revision of his surgery with debridement of some additional portion of the first metatarsal.  Discussed possible risks and complications of the procedure including but not limited to inability of the wound to heal due to his diabetes or continued infection.  No guarantees can be given as to the outcome of surgery.  Questions invited and answered.  We will obtain consent for incision and drainage with debridement of infected soft tissue and bone right foot.  Patient will be n.p.o. after midnight.  Plan is for surgery tomorrow morning.  Durward Fortes 10/02/2022, 10:13 AM

## 2022-10-02 NOTE — ED Notes (Signed)
Inpatient MD at bedside

## 2022-10-02 NOTE — ED Triage Notes (Signed)
Pt comes with c/o right foot possible infection. Pt states he had amputation on November 2nd. Pt states that he had follow up and they did xray and think it might be infected. Pt denies any fevers and pain at this.

## 2022-10-02 NOTE — Inpatient Diabetes Management (Signed)
Inpatient Diabetes Program Recommendations  AACE/ADA: New Consensus Statement on Inpatient Glycemic Control   Target Ranges:  Prepandial:   less than 140 mg/dL      Peak postprandial:   less than 180 mg/dL (1-2 hours)      Critically ill patients:  140 - 180 mg/dL    Latest Reference Range & Units 10/02/22 08:52  Glucose 70 - 99 mg/dL 370 (H)    Latest Reference Range & Units 08/26/22 06:03  Hemoglobin A1C 4.8 - 5.6 % 5.9 (H)   Review of Glycemic Control  Diabetes history: No Outpatient Diabetes medications: NA Current orders for Inpatient glycemic control: None  NOTE: Patient admitted with osteomyelitis of ankle. Diabetes coordinator consulted per lower extremity wound order set. Per chart,patient does not have hx of DM. Will sign off consult. Please reconsult if needed.  Thanks, Orlando Penner, RN, MSN, CDCES Diabetes Coordinator Inpatient Diabetes Program [phone] (Team Pager from 8am to 5pm)

## 2022-10-02 NOTE — H&P (Signed)
History and Physical    Patient: Jacob Love MOQ:[id number] DOB: 1981-06-16 DOA: 10/02/2022 DOS: the patient was seen and examined on 10/02/2022 PCP: [facility]  Patient coming from: Home  Chief Complaint:  Chief Complaint  Patient presents with   Foot Pain   HPI: Jacob Love is a 41 y.o. male with medical history significant for morbid obesity (BMI 45), hypertension, history of Mnire's disease, history of neuropathic foot ulcer, recent admission for severe sepsis from osteomyelitis status post right partial first ray amputation and right second metatarsal head bone biopsy on 08/26/22 and was discharged on antibiotic therapy to follow-up with podiatry as an outpatient. Patient had followed up with podiatry as an outpatient as recommended and noted to have surgical wound dehiscence of the amputation site.  Local wound care was attempted as an outpatient.  He was seen in the outpatient setting 1 day prior to his admission and had an x-ray of his right foot which showed periosteal reaction and heterotopic bone formation around the area of the previous partial first ray amputation, new compared to x-rays that were performed about a month ago consistent with osteomyelitic changes.  Substantial deviation/dislocation of digits 2 and 3 at the metatarsal phalangeal joint and subluxation of the fourth toe at the metatarsal phalangeal joint.  Open ulceration present to the distal aspect of the first metatarsal with subcutaneous gas to this area, does not appear to be related to gaseous infection, more so and where the wound is. Patient was advised to go to the emergency room for further evaluation for infection in his right foot. He denies having any fever or chills, no cough, no dizziness, no lightheadedness, no abdominal pain, no changes in his bowel habits, no urinary symptoms, no leg swelling, no headache, no blurred vision no focal deficit    Review of Systems: As mentioned  in the history of present illness. All other systems reviewed and are negative. Past Medical History:  Diagnosis Date   Hypertension    Meniere disease    Morbid obesity [facility])    Past Surgical History:  Procedure Laterality Date   AMPUTATION Right 08/26/2022   Procedure: RIGHT PARTIAL 1ST RAY AMPUTATION;  Surgeon: Rosetta Posner, DPM;  Location: [facility] ORS;  Service: Podiatry;  Laterality: Right;   NO PAST SURGERIES     Social History:  reports that he has been smoking cigarettes. He has been smoking an average of 1 pack per day. His smokeless tobacco use includes snuff. He reports current alcohol use. He reports that he does not use drugs.  No Known Allergies  History reviewed. No pertinent family history.  Prior to Admission medications   Medication Sig Start Date End Date Taking? Authorizing Provider  HYDROcodone-acetaminophen (NORCO/VICODIN) 5-325 MG tablet Take 1-2 tablets by mouth every 6 (six) hours as needed for severe pain. 08/27/22   Noralee Stain, DO    Physical Exam: Vitals:   10/02/22 0737 10/02/22 0744  BP: (!) 155/96   Pulse: 89   Resp: 20   Temp: 98.3 F (36.8 C)   SpO2: 96%   Weight:  (!) 163.7 kg  Height:  6\' 3"  (1.905 m)   Physical Exam Vitals and nursing note reviewed.  Constitutional:      Appearance: He is obese.  HENT:     Head: Normocephalic and atraumatic.     Nose: Nose normal.     Mouth/Throat:     Mouth: Mucous membranes are moist.  Eyes:  Conjunctiva/sclera: Conjunctivae normal.  Cardiovascular:     Rate and Rhythm: Normal rate and regular rhythm.  Pulmonary:     Effort: Pulmonary effort is normal.     Breath sounds: Normal breath sounds.  Abdominal:     General: Bowel sounds are normal.     Palpations: Abdomen is soft.     Comments: Central adiposity  Musculoskeletal:     Cervical back: Normal range of motion and neck supple.     Comments: Status post amputation of the right great toe with an open wound.  Surrounding erythema   Skin:    General: Skin is warm and dry.  Neurological:     General: No focal deficit present.     Mental Status: He is alert and oriented to person, place, and time.  Psychiatric:        Mood and Affect: Mood normal.        Behavior: Behavior normal.     Data Reviewed: Relevant notes from primary care and specialist visits, past discharge summaries as available in EHR, including Care Everywhere. Prior diagnostic testing as pertinent to current admission diagnoses Updated medications and problem lists for reconciliation ED course, including vitals, labs, imaging, treatment and response to treatment Triage notes, nursing and pharmacy notes and ED provider's notes Notable results as noted in HPI Labs reviewed.  Sodium 137, potassium 3.4, chloride 104, bicarb 24, glucose 134, BUN 10, creatinine 0.75, calcium 9.3, total protein 7.8, albumin 2.8, AST 32, ALT 25, alkaline phosphatase 119, total bilirubin 0.8, white count 8.6, hemoglobin 14, hematocrit 44.4, platelet count 293 There are no new results to review at this time.  Assessment and Plan: * Osteomyelitis of ankle or foot, acute, right (HCC) Patient with a history of neuropathic ulcer involving the right foot status post right partial first ray amputation (11/1) due to significant ulceration present at the plantar medial aspect of the first metatarsophalangeal joint area. Patient had wound dehiscence of his surgical wound and has received wound care as an outpatient Followed up with podiatry and had an x-ray which showed periosteal reaction and heterotopic bone formation around the area of the previous partial first ray amputation, new compared to x-rays that were performed around a month ago consistent with likely osteomyelitic changes.  Substantial deviation/dislocation of digits 2 and 3 at the metatarsal phalangeal joint and subluxation of the fourth toe at the metatarsal phalangeal joint.   Open ulceration present to the distal  aspect of the first metatarsal with subcutaneous gas to this area, does not appear to be related to gaseous infection, more so air where the wound is.  Will place patient empirically on Rocephin and Flagyl IV Podiatry consult for revision of first ray amputation with closure  Essential hypertension Continue hydrochlorothiazide  Morbid obesity with BMI of 45.0-49.9, adult (HCC) BMI 45.1 Complicates overall prognosis and care Lifestyle modification and exercise has been discussed with patient in detail  Hypokalemia Related to diuretic therapy Supplement potassium Check magnesium level      Advance Care Planning:   Code Status: Full Code   Consults: Podiatry  Family Communication: Greater than 50% of time was spent discussing patient's condition and plan of care with him and his mother at the bedside.  All questions and concerns have been addressed.  He verbalizes understanding and agrees with the plan.  Severity of Illness: The appropriate patient status for this patient is INPATIENT. Inpatient status is judged to be reasonable and necessary in order to provide the required intensity  of service to ensure the patient's safety. The patient's presenting symptoms, physical exam findings, and initial radiographic and laboratory data in the context of their chronic comorbidities is felt to place them at high risk for further clinical deterioration. Furthermore, it is not anticipated that the patient will be medically stable for discharge from the hospital within 2 midnights of admission.   * I certify that at the point of admission it is my clinical judgment that the patient will require inpatient hospital care spanning beyond 2 midnights from the point of admission due to high intensity of service, high risk for further deterioration and high frequency of surveillance required.*  Author: Lucile Shutters, MD 10/02/2022 9:58 AM  For on call review [email or web address].

## 2022-10-02 NOTE — ED Provider Notes (Signed)
[facility] Provider Note    Event Date/Time   First MD Initiated Contact with Patient 10/02/22 [phone]     (approximate)  History   Chief Complaint: Foot Pain  HPI  Jacob Love is a 41 y.o. male with a past medical history of hypertension, obesity, recent right great toe amputation 1 month ago who presents to the emergency department for concern for possible infection.  According to the patient at the beginning of November patient had a right foot/toe amputation.  He has been following up with Dr. Excell Seltzer of podiatry.  Patient was seen 2 days ago by podiatry had an x-ray performed that was concerning for possible infection and the patient was referred to the emergency department for further evaluation.  Patient denies any fever denies any pain.  Patient states the wound from the amputation site appears to be closing and denies any significant drainage.  Physical Exam   Triage Vital Signs: ED Triage Vitals  Enc Vitals Group     BP 10/02/22 0737 (!) 155/96     Pulse Rate 10/02/22 0737 89     Resp 10/02/22 0737 20     Temp 10/02/22 0737 98.3 F (36.8 C)     Temp src --      SpO2 10/02/22 0737 96 %     Weight 10/02/22 0744 (!) 360 lb 14.3 oz (163.7 kg)     Height 10/02/22 0744 6\' 3"  (1.905 m)     Head Circumference --      Peak Flow --      Pain Score 10/02/22 0735 0     Pain Loc --      Pain Edu? --      Excl. in GC? --     Most recent vital signs: Vitals:   10/02/22 0737  BP: (!) 155/96  Pulse: 89  Resp: 20  Temp: 98.3 F (36.8 C)  SpO2: 96%    General: Awake, no distress.  CV:  Good peripheral perfusion.  Regular rate and rhythm  Resp:  Normal effort.  Equal breath sounds bilaterally.  Abd:  No distention.  Soft, nontender.  No rebound or guarding. Other:  Patient has an overall well-appearing amputation site there is a small amount of wound dehiscence gaping approximately 1 cm.  No significant drainage noted.   ED Results / Procedures  / Treatments    MEDICATIONS ORDERED IN ED: Medications  vancomycin (VANCOCIN) IVPB 1000 mg/200 mL premix (has no administration in time range)     IMPRESSION / MDM / ASSESSMENT AND PLAN / ED COURSE  I reviewed the triage vital signs and the nursing notes.  Patient's presentation is most consistent with acute presentation with potential threat to life or bodily function.  Patient presents emergency department for concern for worsening/spreading infection in the right lower extremity where the patient had a recent toe amputation approximately 4 to 5 weeks ago.  Patient sent by podiatry who has been monitoring the wound for possible IV antibiotics admission and revision.  The note also mentions possible MRI to decide upon further management.  We will discuss with podiatry for recommendations we will go ahead and get lab work start IV antibiotics and proceed with MRI.  We will decide jointly with podiatry whether the patient requires admission and revision after MRI has resulted.  I spoke with Dr. 14/08/23 who would like to follow-up on the MRI states the patient will need IV antibiotics and admission for revision regardless.  He  will come by later and see the patient.  Patient will be admitted to the hospital service.  FINAL CLINICAL IMPRESSION(S) / ED DIAGNOSES   Right foot infection    Note:  This document was prepared using Dragon voice recognition software and may include unintentional dictation errors.   Minna Antis, MD 10/02/22 [phone]

## 2022-10-02 NOTE — Assessment & Plan Note (Signed)
Related to diuretic therapy Supplement potassium Check magnesium level

## 2022-10-02 NOTE — ED Notes (Signed)
Pt given sandwich, apple sauce and cola. Pt ambulating around room independently and sitting in recliner chair comfortably. Pt denies additional needs or concerns at this time.

## 2022-10-03 ENCOUNTER — Encounter
Admission: EM | Disposition: A | Discharge status: Home / Self Care | Payer: Self-pay | Source: Home / Self Care | Attending: Hospitalist

## 2022-10-03 ENCOUNTER — Inpatient Hospital Stay: Payer: Medicaid Other | Admitting: Registered Nurse

## 2022-10-03 ENCOUNTER — Other Ambulatory Visit: Payer: Self-pay

## 2022-10-03 HISTORY — PX: INCISION AND DRAINAGE OF WOUND: SHX1803

## 2022-10-03 LAB — BASIC METABOLIC PANEL
Anion gap: 9 (ref 5–15)
BUN: 10 mg/dL (ref 6–20)
CO2: 24 mmol/L (ref 22–32)
Calcium: 8.7 mg/dL — ABNORMAL LOW (ref 8.9–10.3)
Chloride: 103 mmol/L (ref 98–111)
Creatinine, Ser: 0.83 mg/dL (ref 0.61–1.24)
GFR, Estimated: >60 mL/min (ref >60)
Glucose, Bld: 129 mg/dL — ABNORMAL HIGH (ref 70–99)
Potassium: 3.4 mmol/L — ABNORMAL LOW (ref 3.5–5.1)
Sodium: 136 mmol/L (ref 135–145)

## 2022-10-03 LAB — CBC
HCT: 41.8 % (ref 39.0–52.0)
Hemoglobin: 13.4 g/dL (ref 13.0–17.0)
MCH: 27.8 pg (ref 26.0–34.0)
MCHC: 32.1 g/dL (ref 30.0–36.0)
MCV: 86.7 fL (ref 80.0–100.0)
Platelets: 266 10*3/uL (ref 150–400)
RBC: 4.82 MIL/uL (ref 4.22–5.81)
RDW: 15.1 % (ref 11.5–15.5)
WBC: 8.6 10*3/uL (ref 4.0–10.5)
nRBC: 0 % (ref 0.0–0.2)

## 2022-10-03 LAB — SURGICAL PCR SCREEN
MRSA, PCR: NEGATIVE
Staphylococcus aureus: NEGATIVE

## 2022-10-03 SURGERY — IRRIGATION AND DEBRIDEMENT WOUND
Anesthesia: General | Laterality: Right

## 2022-10-03 MED ORDER — ACETAMINOPHEN 10 MG/ML IV SOLN: 1000.0000 mg | Freq: Once | INTRAVENOUS | Status: DC | PRN

## 2022-10-03 MED ORDER — 0.9 % SODIUM CHLORIDE (POUR BTL) OPTIME
TOPICAL | Status: DC | PRN
  Administered 2022-10-03: 1000 mL

## 2022-10-03 MED ORDER — OXYMETAZOLINE HCL 0.05 % NA SOLN
NASAL | Status: DC | PRN
  Administered 2022-10-03: 2 via NASAL

## 2022-10-03 MED ORDER — PROPOFOL 10 MG/ML IV BOLUS
INTRAVENOUS | Status: DC | PRN
  Administered 2022-10-03: 30 mg via INTRAVENOUS
  Administered 2022-10-03: 50 mg via INTRAVENOUS

## 2022-10-03 MED ORDER — ACETAMINOPHEN 10 MG/ML IV SOLN
INTRAVENOUS | Status: AC
  Filled 2022-10-03: qty 100

## 2022-10-03 MED ORDER — SODIUM CHLORIDE 0.9% FLUSH: 3.0000 mL | INTRAVENOUS | Status: DC | PRN

## 2022-10-03 MED ORDER — PROPOFOL 500 MG/50ML IV EMUL
INTRAVENOUS | Status: DC | PRN
  Administered 2022-10-03: 150 ug/kg/min via INTRAVENOUS

## 2022-10-03 MED ORDER — SODIUM CHLORIDE 0.9 % IR SOLN
Status: DC | PRN
  Administered 2022-10-03: 1000 mL

## 2022-10-03 MED ORDER — ENOXAPARIN SODIUM 80 MG/0.8ML IJ SOSY
80.0000 mg | PREFILLED_SYRINGE | INTRAMUSCULAR | Status: DC
  Administered 2022-10-03 – 2022-10-07 (×5): 80 mg via SUBCUTANEOUS
  Filled 2022-10-03 (×5): qty 0.8

## 2022-10-03 MED ORDER — MIDAZOLAM HCL 2 MG/2ML IJ SOLN
INTRAMUSCULAR | Status: DC | PRN
  Administered 2022-10-03: 2 mg via INTRAVENOUS

## 2022-10-03 MED ORDER — KETAMINE HCL 50 MG/5ML IJ SOSY
PREFILLED_SYRINGE | INTRAMUSCULAR | Status: AC
  Filled 2022-10-03: qty 5

## 2022-10-03 MED ORDER — KETOROLAC TROMETHAMINE 30 MG/ML IJ SOLN
INTRAMUSCULAR | Status: DC | PRN
  Administered 2022-10-03: 30 mg via INTRAVENOUS

## 2022-10-03 MED ORDER — LIDOCAINE HCL (CARDIAC) PF 100 MG/5ML IV SOSY
PREFILLED_SYRINGE | INTRAVENOUS | Status: DC | PRN
  Administered 2022-10-03: 50 mg via INTRAVENOUS

## 2022-10-03 MED ORDER — MORPHINE SULFATE (PF) 2 MG/ML IV SOLN: 2.0000 mg | INTRAVENOUS | Status: DC | PRN

## 2022-10-03 MED ORDER — ADULT MULTIVITAMIN W/MINERALS CH
1.0000 | ORAL_TABLET | Freq: Every day | ORAL | Status: DC
  Administered 2022-10-03 – 2022-10-08 (×6): 1 via ORAL
  Filled 2022-10-03 (×6): qty 1

## 2022-10-03 MED ORDER — SODIUM CHLORIDE 0.9% FLUSH
3.0000 mL | Freq: Two times a day (BID) | INTRAVENOUS | Status: DC
  Administered 2022-10-03 – 2022-10-07 (×6): 3 mL via INTRAVENOUS

## 2022-10-03 MED ORDER — OXYMETAZOLINE HCL 0.05 % NA SOLN
NASAL | Status: AC
  Filled 2022-10-03: qty 30

## 2022-10-03 MED ORDER — LACTATED RINGERS IV SOLN: INTRAVENOUS | Status: DC | PRN

## 2022-10-03 MED ORDER — ACETAMINOPHEN 10 MG/ML IV SOLN
INTRAVENOUS | Status: DC | PRN
  Administered 2022-10-03: 1000 mg via INTRAVENOUS

## 2022-10-03 MED ORDER — MIDAZOLAM HCL 2 MG/2ML IJ SOLN
INTRAMUSCULAR | Status: AC
  Filled 2022-10-03: qty 2

## 2022-10-03 MED ORDER — VANCOMYCIN HCL 2000 MG/400ML IV SOLN
2000.0000 mg | Freq: Once | INTRAVENOUS | Status: AC
  Administered 2022-10-03: 2000 mg via INTRAVENOUS
  Filled 2022-10-03: qty 400

## 2022-10-03 MED ORDER — ONDANSETRON HCL 4 MG/2ML IJ SOLN: 4.0000 mg | Freq: Once | INTRAMUSCULAR | Status: DC | PRN

## 2022-10-03 MED ORDER — JUVEN PO PACK
1.0000 | PACK | Freq: Two times a day (BID) | ORAL | Status: DC
  Administered 2022-10-04 – 2022-10-08 (×8): 1 via ORAL

## 2022-10-03 MED ORDER — OXYCODONE HCL 5 MG PO TABS: 5.0000 mg | ORAL_TABLET | Freq: Once | ORAL | Status: DC | PRN

## 2022-10-03 MED ORDER — LIDOCAINE HCL (PF) 2 % IJ SOLN
INTRAMUSCULAR | Status: AC
  Filled 2022-10-03: qty 5

## 2022-10-03 MED ORDER — KETAMINE HCL 10 MG/ML IJ SOLN
INTRAMUSCULAR | Status: DC | PRN
  Administered 2022-10-03: 50 mg via INTRAVENOUS

## 2022-10-03 MED ORDER — LACTATED RINGERS IV SOLN: INTRAVENOUS | Status: DC

## 2022-10-03 MED ORDER — OXYCODONE HCL 5 MG/5ML PO SOLN: 5.0000 mg | Freq: Once | ORAL | Status: DC | PRN

## 2022-10-03 MED ORDER — VANCOMYCIN HCL 1500 MG/300ML IV SOLN
1500.0000 mg | Freq: Three times a day (TID) | INTRAVENOUS | Status: DC
  Administered 2022-10-04 – 2022-10-05 (×5): 1500 mg via INTRAVENOUS
  Filled 2022-10-03 (×6): qty 300

## 2022-10-03 MED ORDER — VANCOMYCIN HCL 1000 MG IV SOLR
INTRAVENOUS | Status: DC | PRN
  Administered 2022-10-03: 1000 mg

## 2022-10-03 MED ORDER — PROPOFOL 1000 MG/100ML IV EMUL
INTRAVENOUS | Status: AC
  Filled 2022-10-03: qty 100

## 2022-10-03 MED ORDER — FENTANYL CITRATE (PF) 100 MCG/2ML IJ SOLN: 25.0000 ug | INTRAMUSCULAR | Status: DC | PRN

## 2022-10-03 MED ORDER — PROSOURCE PLUS PO LIQD
30.0000 mL | Freq: Two times a day (BID) | ORAL | Status: DC
  Administered 2022-10-03 – 2022-10-08 (×10): 30 mL via ORAL
  Filled 2022-10-03 (×5): qty 30

## 2022-10-03 MED ORDER — SODIUM CHLORIDE 0.9 % IV SOLN: 250.0000 mL | INTRAVENOUS | Status: DC | PRN

## 2022-10-03 MED ORDER — SODIUM CHLORIDE 0.9 % IV SOLN
INTRAVENOUS | Status: AC | PRN
  Administered 2022-10-03: 1000 mL

## 2022-10-03 MED ORDER — HYDROCODONE-ACETAMINOPHEN 5-325 MG PO TABS
1.0000 | ORAL_TABLET | ORAL | Status: DC | PRN
  Administered 2022-10-03: 1 via ORAL
  Filled 2022-10-03: qty 1

## 2022-10-03 MED ORDER — BUPIVACAINE HCL (PF) 0.5 % IJ SOLN
INTRAMUSCULAR | Status: DC | PRN
  Administered 2022-10-03: 10 mL

## 2022-10-03 MED ORDER — GLYCOPYRROLATE 0.2 MG/ML IJ SOLN
INTRAMUSCULAR | Status: AC
  Filled 2022-10-03: qty 1

## 2022-10-03 SURGICAL SUPPLY — 58 items
BLADE OSCILLATING/SAGITTAL (BLADE) ×1
BLADE SURG 15 STRL LF DISP TIS (BLADE) ×1 IMPLANT
BLADE SURG 15 STRL SS (BLADE) ×1
BLADE SURG MINI STRL (BLADE) IMPLANT
BLADE SW THK.38XMED LNG THN (BLADE) IMPLANT
BNDG CMPR 75X21 PLY HI ABS (MISCELLANEOUS) ×1
BNDG CMPR 75X41 PLY HI ABS (GAUZE/BANDAGES/DRESSINGS) ×2
BNDG CMPR STD VLCR NS LF 5.8X4 (GAUZE/BANDAGES/DRESSINGS) ×1
BNDG ELASTIC 4X5.8 VLCR NS LF (GAUZE/BANDAGES/DRESSINGS) ×1 IMPLANT
BNDG ELASTIC 4X5.8 VLCR STR LF (GAUZE/BANDAGES/DRESSINGS) IMPLANT
BNDG ESMARK 4X12 TAN STRL LF (GAUZE/BANDAGES/DRESSINGS) ×1 IMPLANT
BNDG GAUZE DERMACEA FLUFF 4 (GAUZE/BANDAGES/DRESSINGS) ×1 IMPLANT
BNDG GZE DERMACEA 4 6PLY (GAUZE/BANDAGES/DRESSINGS) ×1
BNDG STRETCH 4X75 STRL LF (GAUZE/BANDAGES/DRESSINGS) IMPLANT
CUFF TOURN SGL QUICK 12 (TOURNIQUET CUFF) IMPLANT
CUFF TOURN SGL QUICK 18X4 (TOURNIQUET CUFF) IMPLANT
DRAPE FLUOR MINI C-ARM 54X84 (DRAPES) IMPLANT
DRSG XEROFORM 1X8 (GAUZE/BANDAGES/DRESSINGS) IMPLANT
DURAPREP 26ML APPLICATOR (WOUND CARE) ×1 IMPLANT
ELECT REM PT RETURN 9FT ADLT (ELECTROSURGICAL) ×1
ELECTRODE REM PT RTRN 9FT ADLT (ELECTROSURGICAL) ×1 IMPLANT
GAUZE SPONGE 4X4 12PLY STRL (GAUZE/BANDAGES/DRESSINGS) ×1 IMPLANT
GAUZE STRETCH 2X75IN STRL (MISCELLANEOUS) ×1 IMPLANT
GAUZE XEROFORM 1X8 LF (GAUZE/BANDAGES/DRESSINGS) ×1 IMPLANT
GLOVE BIO SURGEON STRL SZ7.5 (GLOVE) ×1 IMPLANT
GLOVE SURG UNDER LTX SZ8 (GLOVE) ×1 IMPLANT
GOWN STRL REUS W/ TWL LRG LVL3 (GOWN DISPOSABLE) ×2 IMPLANT
GOWN STRL REUS W/TWL LRG LVL3 (GOWN DISPOSABLE) ×2
HANDPIECE VERSAJET DEBRIDEMENT (MISCELLANEOUS) ×1 IMPLANT
IV NS 1000ML (IV SOLUTION) ×1
IV NS 1000ML BAXH (IV SOLUTION) IMPLANT
KIT STIMULAN RAPID CURE 5CC (Orthopedic Implant) IMPLANT
KIT TURNOVER KIT A (KITS) ×1 IMPLANT
LABEL OR SOLS (LABEL) ×1 IMPLANT
MANIFOLD NEPTUNE II (INSTRUMENTS) ×1 IMPLANT
NDL FILTER BLUNT 18X1 1/2 (NEEDLE) ×1 IMPLANT
NDL HYPO 25X1 1.5 SAFETY (NEEDLE) ×3 IMPLANT
NEEDLE FILTER BLUNT 18X1 1/2 (NEEDLE) ×1 IMPLANT
NEEDLE HYPO 25X1 1.5 SAFETY (NEEDLE) ×3 IMPLANT
NS IRRIG 500ML POUR BTL (IV SOLUTION) ×1 IMPLANT
PACK EXTREMITY ARMC (MISCELLANEOUS) ×1 IMPLANT
PAD ABD DERMACEA PRESS 5X9 (GAUZE/BANDAGES/DRESSINGS) ×2 IMPLANT
RASP SM TEAR CROSS CUT (RASP) IMPLANT
SOL PREP PVP 2OZ (MISCELLANEOUS) ×1
SOLUTION PREP PVP 2OZ (MISCELLANEOUS) ×1 IMPLANT
STOCKINETTE STRL 6IN 960660 (GAUZE/BANDAGES/DRESSINGS) ×1 IMPLANT
SUT ETHILON 3-0 FS-10 30 BLK (SUTURE) ×1
SUT ETHILON 4-0 (SUTURE) ×1
SUT ETHILON 4-0 FS2 18XMFL BLK (SUTURE) ×1
SUT VIC AB 3-0 SH 27 (SUTURE) ×1
SUT VIC AB 3-0 SH 27X BRD (SUTURE) ×1 IMPLANT
SUT VIC AB 4-0 FS2 27 (SUTURE) ×1 IMPLANT
SUTURE EHLN 3-0 FS-10 30 BLK (SUTURE) ×1 IMPLANT
SUTURE ETHLN 4-0 FS2 18XMF BLK (SUTURE) ×1 IMPLANT
SYR 10ML LL (SYRINGE) ×2 IMPLANT
SYR 3ML LL SCALE MARK (SYRINGE) ×1 IMPLANT
TRAP FLUID SMOKE EVACUATOR (MISCELLANEOUS) ×1 IMPLANT
WATER STERILE IRR 500ML POUR (IV SOLUTION) ×1 IMPLANT

## 2022-10-03 NOTE — Plan of Care (Signed)
Patient sleeping between care. Aox4. Denies pain. Plan of care reviewed with patient. NPO since midnight for surgery today. Call bell within reach.    Problem: Education: Goal: Knowledge of General Education information will improve Description: Including pain rating scale, medication(s)/side effects and non-pharmacologic comfort measures Outcome: Progressing   Problem: Health Behavior/Discharge Planning: Goal: Ability to manage health-related needs will improve Outcome: Progressing   Problem: Clinical Measurements: Goal: Ability to maintain clinical measurements within normal limits will improve Outcome: Progressing Goal: Will remain free from infection Outcome: Progressing Goal: Diagnostic test results will improve Outcome: Progressing Goal: Respiratory complications will improve Outcome: Progressing Goal: Cardiovascular complication will be avoided Outcome: Progressing   Problem: Activity: Goal: Risk for activity intolerance will decrease Outcome: Progressing   Problem: Nutrition: Goal: Adequate nutrition will be maintained Outcome: Progressing   Problem: Coping: Goal: Level of anxiety will decrease Outcome: Progressing   Problem: Elimination: Goal: Will not experience complications related to bowel motility Outcome: Progressing Goal: Will not experience complications related to urinary retention Outcome: Progressing   Problem: Pain Managment: Goal: General experience of comfort will improve Outcome: Progressing   Problem: Safety: Goal: Ability to remain free from injury will improve Outcome: Progressing   Problem: Skin Integrity: Goal: Risk for impaired skin integrity will decrease Outcome: Progressing

## 2022-10-03 NOTE — Interval H&P Note (Signed)
History and Physical Interval Note:  10/03/2022 7:38 AM  Jacob Love  has presented today for surgery, with the diagnosis of osteomyelitis.  The various methods of treatment have been discussed with the patient and family. After consideration of risks, benefits and other options for treatment, the patient has consented to  Procedure(s): IRRIGATION AND DEBRIDEMENT SOFT TISSUE AND BONE RIGHT FOOT (Right) as a surgical intervention.  The patient's history has been reviewed, patient examined, no change in status, stable for surgery.  I have reviewed the patient's chart and labs.  Questions were answered to the patient's satisfaction.     Ricci Barker

## 2022-10-03 NOTE — Transfer of Care (Signed)
Immediate Anesthesia Transfer of Care Note  Patient: Jacob Love  Procedure(s) Performed: IRRIGATION AND DEBRIDEMENT SOFT TISSUE AND BONE RIGHT FOOT (Right)  Patient Location: PACU  Anesthesia Type:General  Level of Consciousness: drowsy  Airway & Oxygen Therapy: Patient Spontanous Breathing and Patient connected to face mask oxygen  Post-op Assessment: Report given to RN and Post -op Vital signs reviewed and stable  Post vital signs: Reviewed and stable  Last Vitals:  Vitals Value Taken Time  BP 143/91 10/03/22 0901  Temp    Pulse 81 10/03/22 0905  Resp 18 10/03/22 0905  SpO2 100 % 10/03/22 0905  Vitals shown include unvalidated device data.  Last Pain:  Vitals:   10/02/22 2330  TempSrc:   PainSc: 0-No pain         Complications: No notable events documented.

## 2022-10-03 NOTE — Op Note (Signed)
Date of operation: 10/03/2022.  Surgeon: Revonda Standard D.P.M.  Preoperative diagnosis: Chronic osteomyelitis right first metatarsal head with abscess status post ray resection.  Postoperative diagnosis: Same.  Procedure: 1.  I&D infected soft tissue and bone right foot. 2.  Rotational skin closure right foot.  Anesthesia: Local MAC.  Hemostasis: Esmarch bandage right ankle.  Estimated blood loss: Less than 5 cc.  Cultures: Bone right first metatarsal.  Pathology: Right first metatarsal.  Implants: Stimulan RectiCare antibiotic beads impregnated with vancomycin.  Complications: None apparent.  Operative indications: This is a 41 year old male with recent first ray resection due to full-thickness ulceration with osteomyelitis.  Postoperatively continued to have open wound with x-ray changes in the first metatarsal consistent with continued osteomyelitis.  Decision was made for hospitalization and repeat I&D on the right foot.  Operative procedure: Patient was taken to the operating room and placed on the table in the supine position.  Following satisfactory sedation the right foot was anesthetized with 10 cc of 0.5% Marcaine plain around the first metatarsal.  The foot was prepped and draped in the usual sterile fashion.  The right foot was exsanguinated using an Esmarch which was then left intact around the ankle to act as a tourniquet.  Attention was was directed to the right foot where an ulceration was noted through the previous incision the first metatarsal.  An elliptical incision was made around the ulceration from lateral to medial excising the ulceration and then extending along the medial aspect of the first metatarsal.  Incision was carried sharply down to the level of bone.  A pocket of purulent fluid was encountered and culture was taken of the deep abscess.  Soft tissues were then freed from the distal aspect of the hypertrophied first metatarsal incised using a sagittal saw and  the bone was removed.  Rongeur used to take a sample for culture and the bone was passed off for pathology.  Devitalized tissue was noted along the dorsal aspect towards the second metatarsal.  The entire wound was debrided using a Versajet debrider on a setting of 4.  The wound was flushed with copious amounts of sterile saline.  Stimulan right ventricular antibiotic beads impregnated with vancomycin was planted into the wound.  The dorsal portion of the incision was then slightly advanced distally and the plantar flap slightly proximally to allow for direct closure of the wound and an S shaped fashion.  The wound was then closed using 3-0 nylon vertical mattress and simple interrupted sutures.  Xeroform 4 x 4's ABDs and Kerlix applied to the right foot.  Tourniquet was released.  Second Kerlix and Ace wrap were then applied to the right foot for compression.  Patient was awakened and transported to PACU having tolerated the anesthesia and procedures well.

## 2022-10-03 NOTE — Progress Notes (Signed)
Initial Nutrition Assessment  DOCUMENTATION CODES:   Obesity unspecified  INTERVENTION:   Multivitamin w/ minerals daily 30 ml ProSource Plus BID, each supplement provides 100 kcals and 15 grams protein.  1 packet Juven BID, each packet provides 95 calories, 2.5 grams of protein (collagen), and 9.8 grams of carbohydrate (3 grams sugar); also contains 7 grams of L-arginine and L-glutamine, 300 mg vitamin C, 15 mg vitamin E, 1.2 mcg vitamin B-12, 9.5 mg zinc, 200 mg calcium, and 1.5 g  Calcium Beta-hydroxy-Beta-methylbutyrate to support wound healing Encourage good PO intake  NUTRITION DIAGNOSIS:   Increased nutrient needs related to wound healing as evidenced by estimated needs.  GOAL:   Patient will meet greater than or equal to 90% of their needs  MONITOR:   PO intake, Supplement acceptance, Skin, Labs  REASON FOR ASSESSMENT:   Consult Wound healing  ASSESSMENT:   41 y.o. male presented to the ED for evaluation of R foot for possible infection. 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 PMH includes HTN and Meniere disease. Pt admitted with osteomyelitis of R foot.   12/09 - OR s/p I&D, rotational skin closure   RD working remotely at time pf assessment. No meal intakes documented. Unable to assess long-term weight trends due to the lack of documentation.   RD will order nutritional supplements to assist with wound healing.   Medications reviewed and include: Hydrodiuril, IV antibiotics  Labs reviewed: Potassium 3.4, Hg A1c 5.9%    NUTRITION - FOCUSED PHYSICAL EXAM:  Deferred to follow-up.  Diet Order:   Diet Order             Diet regular Room service appropriate? Yes; Fluid consistency: Thin  Diet effective now                   EDUCATION NEEDS:   No education needs have been identified at this time  Skin:  Skin Assessment: Reviewed RN Assessment  Last BM:  12/8  Height:   Ht Readings from Last 1 Encounters:  10/02/22 6\' 3"  (1.905 m)     Weight:   Wt Readings from Last 1 Encounters:  10/02/22 (!) 163.7 kg    Ideal Body Weight:  89.1 kg  BMI:  Body mass index is 45.11 kg/m.  Estimated Nutritional Needs:   Kcal:  2300-2500  Protein:  120-140 grams  Fluid:  >/= 2 L    14/08/23 RD, LDN Clinical Dietitian See [facility] for contact information.

## 2022-10-03 NOTE — Anesthesia Postprocedure Evaluation (Signed)
Anesthesia Post Note  Patient: Jacob Love  Procedure(s) Performed: IRRIGATION AND DEBRIDEMENT SOFT TISSUE AND BONE RIGHT FOOT (Right)  Patient location during evaluation: PACU Anesthesia Type: General Level of consciousness: awake and alert, oriented and patient cooperative Pain management: pain level controlled Vital Signs Assessment: post-procedure vital signs reviewed and stable Respiratory status: spontaneous breathing, nonlabored ventilation and respiratory function stable Cardiovascular status: blood pressure returned to baseline and stable Postop Assessment: adequate PO intake Anesthetic complications: no   No notable events documented.   Last Vitals:  Vitals:   10/03/22 0945 10/03/22 1024  BP:  (!) 127/91  Pulse: (!) 58 69  Resp: (!) 21 17  Temp:  36.5 C  SpO2: 99% 99%    Last Pain:  Vitals:   10/03/22 1012  TempSrc:   PainSc: 0-No pain                 Reed Breech

## 2022-10-03 NOTE — Progress Notes (Signed)
PHARMACIST - PHYSICIAN COMMUNICATION  CONCERNING:  Enoxaparin (Lovenox) for DVT Prophylaxis   DESCRIPTION: Patient was prescribed enoxaprin 40mg  q24 hours for VTE prophylaxis.   Filed Weights   10/02/22 0744  Weight: (!) 163.7 kg (360 lb 14.3 oz)    Body mass index is 45.11 kg/m.  Estimated Creatinine Clearance: 188.6 mL/min (by C-G formula based on SCr of 0.83 mg/dL).   Based on [facility] policy patient is candidate for enoxaparin 0.5mg /kg TBW SQ every 24 hours based on BMI being >30.  RECOMMENDATION: Pharmacy has adjusted enoxaparin dose per [facility] policy.  Patient is now receiving enoxaparin 80 mg every 24 hours    [facility], PharmD Clinical Pharmacist  10/03/2022 7:35 PM

## 2022-10-03 NOTE — Anesthesia Preprocedure Evaluation (Addendum)
Anesthesia Evaluation  Patient identified by MRN, date of birth, ID band Patient awake    Reviewed: Allergy & Precautions, NPO status , Patient's Chart, lab work & pertinent test results  History of Anesthesia Complications Negative for: history of anesthetic complications  Airway Mallampati: III   Neck ROM: Full    Dental  (+) Missing   Pulmonary former smoker (quit 1 month ago)   Pulmonary exam normal breath sounds clear to auscultation       Cardiovascular hypertension, Normal cardiovascular exam Rhythm:Regular Rate:Normal  ECG 08/26/22: normal   Neuro/Psych Meniere disease    GI/Hepatic negative GI ROS,,,  Endo/Other  Prediabetes, class 3 obesity  Renal/GU negative Renal ROS     Musculoskeletal   Abdominal   Peds  Hematology negative hematology ROS (+)   Anesthesia Other Findings   Reproductive/Obstetrics                             Anesthesia Physical Anesthesia Plan  ASA: 3  Anesthesia Plan: General   Post-op Pain Management:    Induction: Intravenous  PONV Risk Score and Plan: 1 and Propofol infusion, TIVA, Treatment may vary due to age or medical condition and Ondansetron  Airway Management Planned: Natural Airway  Additional Equipment:   Intra-op Plan:   Post-operative Plan:   Informed Consent: I have reviewed the patients History and Physical, chart, labs and discussed the procedure including the risks, benefits and alternatives for the proposed anesthesia with the patient or authorized representative who has indicated his/her understanding and acceptance.       Plan Discussed with: CRNA  Anesthesia Plan Comments: (LMA/GETA backup discussed.  Patient consented for risks of anesthesia including but not limited to:  - adverse reactions to medications - damage to eyes, teeth, lips or other oral mucosa - nerve damage due to positioning  - sore throat or  hoarseness - damage to heart, brain, nerves, lungs, other parts of body or loss of life  Informed patient about role of CRNA in peri- and intra-operative care.  Patient voiced understanding.)        Anesthesia Quick Evaluation

## 2022-10-03 NOTE — Consult Note (Signed)
Pharmacy Antibiotic Note  Jacob Love is a 41 y.o. male admitted on 10/02/2022 with osteomyelitis.  Pharmacy has been consulted for vancomycin dosing. S/p I&D of right foot with implantation of vancomycin beads.   Plan: Vancomycin 1000 mg IV given on 12/8 at 0855 Will give vancomycin 2000 mg IV loading dose, followed by 1500 mg IV q8h Goal AUC 400-550  Est AUC: 467 Est Cmax: 30.1 Est Cmin: 14 Calculated with SCr 0.83  Monitor clinical picture, renal function, and vancomycin levels at steady state F/U C&S, abx deescalation / LOT   Height: 6\' 3"  (190.5 cm) Weight: (!) 163.7 kg (360 lb 14.3 oz) IBW/kg (Calculated) : 84.5  Temp (24hrs), Avg:98.4 F (36.9 C), Min:97.7 F (36.5 C), Max:99.3 F (37.4 C)  Recent Labs  Lab 10/02/22 0852 10/03/22 0432  WBC 8.6 8.6  CREATININE 0.75 0.83    Estimated Creatinine Clearance: 188.6 mL/min (by C-G formula based on SCr of 0.83 mg/dL).    No Known Allergies  Antimicrobials this admission: Vancomycin 12/8 >>  Ceftriaxone 12/8 >>  Flagyl 12/8 >>   Dose adjustments this admission:   Microbiology results: 12/8 BCx: Pending 12/9 abscess Cx: pending   Thank you for allowing pharmacy to be a part of this patient's care.  14/9, PharmD 10/03/2022 7:36 PM

## 2022-10-03 NOTE — Progress Notes (Signed)
  PROGRESS NOTE    Jacob Love  HER:[id number] DOB: 1981/06/24 DOA: 10/02/2022 PCP: [facility]  136A/136A-AA  LOS: 1 day   Brief hospital course:   Assessment & Plan: Jacob Love is a 41 y.o. male with medical history significant for morbid obesity (BMI 45), hypertension, history of Mnire's disease, history of neuropathic foot ulcer, recent admission for severe sepsis from osteomyelitis status post right partial first ray amputation and right second metatarsal head bone biopsy on 08/26/22 and was discharged on antibiotic therapy to follow-up with podiatry as an outpatient.  Continues to have a chronic open wound at the surgical site and recently seen outpatient with the wound probing down to the level of the bone with x-ray changes consistent with continued osteomyelitis. Decision was made for hospitalization and revision of his surgery.   * Osteomyelitis of ankle or foot, acute, right (HCC) Full-thickness ulceration with abscess right foot  --surgical I/D of right foot today --cont vanc, ceftriaxone and flagyl  Essential hypertension Continue hydrochlorothiazide   Morbid obesity with BMI of 45.0-49.9, adult (HCC) BMI 45.1 Complicates overall prognosis and care Lifestyle modification and exercise has been discussed with patient in detail   Hypokalemia --monitor and replete PRN  Prediabetes --recent A1c 5.9    DVT prophylaxis: Lovenox SQ Code Status: Full code  Family Communication:  Level of care: Med-Surg Dispo:   The patient is from: home Anticipated d/c is to: home Anticipated d/c date is: 2-3 days   Subjective and Interval History:  Went for surgical I/D of right foot today.  Tolerated it well.  No pain.   Objective: Vitals:   10/03/22 0940 10/03/22 0945 10/03/22 1024 10/03/22 1702  BP:   (!) 127/91 (!) 125/56  Pulse: 84 (!) 58 69 73  Resp: (!) 23 (!) 21 17 18   Temp: 97.9 F (36.6 C)  97.7 F (36.5 C) 98.7 F (37.1 C)  TempSrc:       SpO2: 98% 99% 99% 100%  Weight:      Height:        Intake/Output Summary (Last 24 hours) at 10/03/2022 1923 Last data filed at 10/03/2022 1500 Gross per 24 hour  Intake 1100 ml  Output --  Net 1100 ml   Filed Weights   10/02/22 0744  Weight: (!) 163.7 kg    Examination:   Constitutional: NAD, AAOx3 HEENT: conjunctivae and lids normal, EOMI CV: No cyanosis.   RESP: normal respiratory effort, on RA Extremities: right foot wrapped and in surgical shoe Neuro: II - XII grossly intact.   Psych: Normal mood and affect.  Appropriate judgement and reason   Data Reviewed: I have personally reviewed labs and imaging studies  Time spent: 35 minutes  14/08/23, MD [facility] If 7PM-7AM, please contact night-coverage 10/03/2022, 7:23 PM

## 2022-10-04 ENCOUNTER — Inpatient Hospital Stay: Payer: Medicaid Other

## 2022-10-04 LAB — CBC
HCT: 40.2 % (ref 39.0–52.0)
Hemoglobin: 12.8 g/dL — ABNORMAL LOW (ref 13.0–17.0)
MCH: 28.1 pg (ref 26.0–34.0)
MCHC: 31.8 g/dL (ref 30.0–36.0)
MCV: 88.2 fL (ref 80.0–100.0)
Platelets: 239 10*3/uL (ref 150–400)
RBC: 4.56 MIL/uL (ref 4.22–5.81)
RDW: 15 % (ref 11.5–15.5)
WBC: 8.5 10*3/uL (ref 4.0–10.5)
nRBC: 0 % (ref 0.0–0.2)

## 2022-10-04 LAB — BASIC METABOLIC PANEL
Anion gap: 6 (ref 5–15)
BUN: 11 mg/dL (ref 6–20)
CO2: 24 mmol/L (ref 22–32)
Calcium: 8.7 mg/dL — ABNORMAL LOW (ref 8.9–10.3)
Chloride: 105 mmol/L (ref 98–111)
Creatinine, Ser: 0.76 mg/dL (ref 0.61–1.24)
GFR, Estimated: >60 mL/min (ref >60)
Glucose, Bld: 122 mg/dL — ABNORMAL HIGH (ref 70–99)
Potassium: 3.7 mmol/L (ref 3.5–5.1)
Sodium: 135 mmol/L (ref 135–145)

## 2022-10-04 LAB — MAGNESIUM: Magnesium: 2.1 mg/dL (ref 1.7–2.4)

## 2022-10-04 NOTE — Progress Notes (Signed)
1 Day Post-Op   Subjective/Chief Complaint: Patient seen.  No complaints of pain.   Objective: Vital signs in last 24 hours: Temp:  [98.1 F (36.7 C)-98.7 F (37.1 C)] 98.7 F (37.1 C) (12/10 0805) Pulse Rate:  [64-77] 64 (12/10 0805) Resp:  [17-18] 17 (12/10 0805) BP: (103-131)/(56-74) 117/73 (12/10 0805) SpO2:  [96 %-100 %] 97 % (12/10 0805) Last BM Date : 10/02/22  Intake/Output from previous day: 12/09 0701 - 12/10 0700 In: 1100 [I.V.:700; IV Piggyback:400] Out: -  Intake/Output this shift: Total I/O In: -  Out: 500 [Urine:500]  Significant bleeding with some strikethrough noted on the bandaging.  Upon removal the incision appears well coapted with some Heema sanguinous expressible fluid from the incision with some mild maceration in the central portion.  Some edema in the left foot but no significant cellulitis.  Lab Results:  Recent Labs    10/03/22 0432 10/04/22 0415  WBC 8.6 8.5  HGB 13.4 12.8*  HCT 41.8 40.2  PLT 266 239   BMET Recent Labs    10/03/22 0432 10/04/22 0415  NA 136 135  K 3.4* 3.7  CL 103 105  CO2 24 24  GLUCOSE 129* 122*  BUN 10 11  CREATININE 0.83 0.76  CALCIUM 8.7* 8.7*   PT/INR No results for input(s): "LABPROT", "INR" in the last 72 hours. ABG No results for input(s): "PHART", "HCO3" in the last 72 hours.  Invalid input(s): "PCO2", "PO2"  Studies/Results: No results found.  Anti-infectives: Anti-infectives (From admission, onward)    Start     Dose/Rate Route Frequency Ordered Stop   10/04/22 0500  vancomycin (VANCOREADY) IVPB 1500 mg/300 mL        1,500 mg 150 mL/hr over 120 Minutes Intravenous Every 8 hours 10/03/22 2040     10/03/22 2100  vancomycin (VANCOREADY) IVPB 2000 mg/400 mL        2,000 mg 200 mL/hr over 120 Minutes Intravenous  Once 10/03/22 2001 10/03/22 2312   10/03/22 0839  vancomycin (VANCOCIN) powder  Status:  Discontinued          As needed 10/03/22 0840 10/03/22 0859   10/02/22 1000  cefTRIAXone  (ROCEPHIN) 2 g in sodium chloride 0.9 % 100 mL IVPB       See Hyperspace for full Linked Orders Report.   2 g 200 mL/hr over 30 Minutes Intravenous Every 24 hours 10/02/22 0946 10/09/22 0959   10/02/22 1000  metroNIDAZOLE (FLAGYL) IVPB 500 mg       See Hyperspace for full Linked Orders Report.   500 mg 100 mL/hr over 60 Minutes Intravenous Every 8 hours 10/02/22 0946 10/09/22 0959   10/02/22 0815  vancomycin (VANCOCIN) IVPB 1000 mg/200 mL premix        1,000 mg 200 mL/hr over 60 Minutes Intravenous  Once 10/02/22 0802 10/02/22 1055       Assessment/Plan: s/p Procedure(s): IRRIGATION AND DEBRIDEMENT SOFT TISSUE AND BONE RIGHT FOOT (Right) Assessment: Stable status post I&D left foot.  Plan: Betadine gauze applied to the incision followed by a bulky gauze bandage and Ace wrap.  Patient instructed for limited weightbearing on the left heel with bathroom privileges only.  Continue to follow and wait for culture and pathology results.  LOS: 2 days    Ricci Barker 10/04/2022

## 2022-10-04 NOTE — Progress Notes (Signed)
  PROGRESS NOTE    Jacob Love  OHY:[id number] DOB: [birth year] DOA: 10/02/2022 PCP: [facility]  136A/136A-AA  LOS: 2 days   Brief hospital course:   Assessment & Plan: Jacob Love is a 41 y.o. male with medical history significant for morbid obesity (BMI 45), hypertension, history of Mnire's disease, history of neuropathic foot ulcer, recent admission for severe sepsis from osteomyelitis status post right partial first ray amputation and right second metatarsal head bone biopsy on 08/26/22 and was discharged on antibiotic therapy to follow-up with podiatry as an outpatient.  Continues to have a chronic open wound at the surgical site and recently seen outpatient with the wound probing down to the level of the bone with x-ray changes consistent with continued osteomyelitis. Decision was made for hospitalization and revision of his surgery.   * Osteomyelitis of ankle or foot, acute, right (HCC) Full-thickness ulceration with abscess right foot  S/p surgical I/D of right foot on 12/9 --cont vanc, ceftriaxone and flagyl --limited weightbearing on the right heel  --f/u surgical cx  Essential hypertension Continue hydrochlorothiazide   Morbid obesity with BMI of 45.0-49.9, adult (HCC) BMI 45.1 Complicates overall prognosis and care Lifestyle modification and exercise has been discussed with patient in detail   Hypokalemia --monitor and replete PRN  Prediabetes --recent A1c 5.9    DVT prophylaxis: Lovenox SQ Code Status: Full code  Family Communication:  Level of care: Med-Surg Dispo:   The patient is from: home Anticipated d/c is to: home Anticipated d/c date is: 2-3 days   Subjective and Interval History:  No complaint of pain.  Podiatry did wound check today.   Objective: Vitals:   10/04/22 0048 10/04/22 0354 10/04/22 0805 10/04/22 1535  BP: 119/67 (!) 103/58 117/73 123/76  Pulse: 73 69 64 77  Resp: 18 18 17 17   Temp: 98.1 F (36.7 C)  98.5 F (36.9 C) 98.7 F (37.1 C) 98.3 F (36.8 C)  TempSrc:      SpO2: 96% 98% 97% 95%  Weight:      Height:        Intake/Output Summary (Last 24 hours) at 10/04/2022 1536 Last data filed at 10/04/2022 1121 Gross per 24 hour  Intake 100 ml  Output 800 ml  Net -700 ml   Filed Weights   10/02/22 0744  Weight: (!) 163.7 kg    Examination:   Constitutional: NAD, AAOx3 HEENT: conjunctivae and lids normal, EOMI CV: No cyanosis.   RESP: normal respiratory effort, on RA Extremities: right foot wrapped SKIN: warm, dry Neuro: II - XII grossly intact.   Psych: Normal mood and affect.  Appropriate judgement and reason   Data Reviewed: I have personally reviewed labs and imaging studies  Time spent: 25 minutes  14/08/23, MD [facility] If 7PM-7AM, please contact night-coverage 10/04/2022, 3:36 PM

## 2022-10-04 NOTE — Plan of Care (Signed)
?  Problem: Activity: ?Goal: Risk for activity intolerance will decrease ?Outcome: Progressing ?  ?Problem: Nutrition: ?Goal: Adequate nutrition will be maintained ?Outcome: Progressing ?  ?Problem: Elimination: ?Goal: Will not experience complications related to urinary retention ?Outcome: Progressing ?  ?Problem: Pain Managment: ?Goal: General experience of comfort will improve ?Outcome: Progressing ?  ?

## 2022-10-05 ENCOUNTER — Encounter: Payer: Self-pay | Admitting: Podiatry

## 2022-10-05 LAB — CBC
HCT: 42 % (ref 39.0–52.0)
Hemoglobin: 13.2 g/dL (ref 13.0–17.0)
MCH: 27.3 pg (ref 26.0–34.0)
MCHC: 31.4 g/dL (ref 30.0–36.0)
MCV: 86.8 fL (ref 80.0–100.0)
Platelets: 256 10*3/uL (ref 150–400)
RBC: 4.84 MIL/uL (ref 4.22–5.81)
RDW: 15.2 % (ref 11.5–15.5)
WBC: 9.5 10*3/uL (ref 4.0–10.5)
nRBC: 0 % (ref 0.0–0.2)

## 2022-10-05 LAB — BASIC METABOLIC PANEL
Anion gap: 5 (ref 5–15)
BUN: 11 mg/dL (ref 6–20)
CO2: 25 mmol/L (ref 22–32)
Calcium: 9 mg/dL (ref 8.9–10.3)
Chloride: 107 mmol/L (ref 98–111)
Creatinine, Ser: 0.65 mg/dL (ref 0.61–1.24)
GFR, Estimated: >60 mL/min (ref >60)
Glucose, Bld: 123 mg/dL — ABNORMAL HIGH (ref 70–99)
Potassium: 4.1 mmol/L (ref 3.5–5.1)
Sodium: 137 mmol/L (ref 135–145)

## 2022-10-05 LAB — MAGNESIUM: Magnesium: 2.1 mg/dL (ref 1.7–2.4)

## 2022-10-05 LAB — VANCOMYCIN, TROUGH: Vancomycin Tr: 19 ug/mL (ref 15–20)

## 2022-10-05 MED ORDER — VANCOMYCIN HCL 2000 MG/400ML IV SOLN
2000.0000 mg | Freq: Two times a day (BID) | INTRAVENOUS | Status: DC
  Administered 2022-10-06 – 2022-10-08 (×6): 2000 mg via INTRAVENOUS
  Filled 2022-10-05 (×6): qty 400

## 2022-10-05 NOTE — Progress Notes (Signed)
2 Days Post-Op   Subjective/Chief Complaint: Patient seen.  No complaints.   Objective: Vital signs in last 24 hours: Temp:  [97.8 F (36.6 C)-98.7 F (37.1 C)] 97.8 F (36.6 C) (12/11 0730) Pulse Rate:  [64-79] 64 (12/11 0730) Resp:  [17-18] 18 (12/11 0730) BP: (105-135)/(47-80) 135/80 (12/11 0730) SpO2:  [95 %-98 %] 97 % (12/11 0730) Last BM Date : 10/04/22  Intake/Output from previous day: 12/10 0701 - 12/11 0700 In: 700 [IV Piggyback:700] Out: 3575 [Urine:3575] Intake/Output this shift: No intake/output data recorded.  Bandage is dry and intact.  Upon removal there is still some moderate to heavy bleeding on the bandaging.  Still some mild hemosanguinous drainage but no signs of purulence.  Overall stable appearing..  Lab Results:  Recent Labs    10/04/22 0415 10/05/22 0543  WBC 8.5 9.5  HGB 12.8* 13.2  HCT 40.2 42.0  PLT 239 256   BMET Recent Labs    10/04/22 0415 10/05/22 0543  NA 135 137  K 3.7 4.1  CL 105 107  CO2 24 25  GLUCOSE 122* 123*  BUN 11 11  CREATININE 0.76 0.65  CALCIUM 8.7* 9.0   PT/INR No results for input(s): "LABPROT", "INR" in the last 72 hours. ABG No results for input(s): "PHART", "HCO3" in the last 72 hours.  Invalid input(s): "PCO2", "PO2"  Studies/Results: DG Foot Complete Right  Result Date: 10/04/2022 CLINICAL DATA:  Status post incision and debridement of right with placement of antibiotic impregnated beads. EXAM: RIGHT FOOT COMPLETE - 3 VIEW COMPARISON:  Right foot radiographs dated 08/26/2022 FINDINGS: Postsurgical changes from incision and debridement of right foot with further proximal amputation of the first metatarsal. Rounded radiodensities correspond to antibiotic impregnated beads. IMPRESSION: Postsurgical changes from incision and debridement of right foot with further proximal amputation of the first metatarsal. Rounded radiodensities correspond to antibiotic impregnated beads. Electronically Signed   By: Agustin Cree M.D.   On: 10/04/2022 15:15    Anti-infectives: Anti-infectives (From admission, onward)    Start     Dose/Rate Route Frequency Ordered Stop   10/04/22 0500  vancomycin (VANCOREADY) IVPB 1500 mg/300 mL        1,500 mg 150 mL/hr over 120 Minutes Intravenous Every 8 hours 10/03/22 2040     10/03/22 2100  vancomycin (VANCOREADY) IVPB 2000 mg/400 mL        2,000 mg 200 mL/hr over 120 Minutes Intravenous  Once 10/03/22 2001 10/03/22 2312   10/03/22 0839  vancomycin (VANCOCIN) powder  Status:  Discontinued          As needed 10/03/22 0840 10/03/22 0859   10/02/22 1000  cefTRIAXone (ROCEPHIN) 2 g in sodium chloride 0.9 % 100 mL IVPB       See Hyperspace for full Linked Orders Report.   2 g 200 mL/hr over 30 Minutes Intravenous Every 24 hours 10/02/22 0946 10/09/22 0959   10/02/22 1000  metroNIDAZOLE (FLAGYL) IVPB 500 mg       See Hyperspace for full Linked Orders Report.   500 mg 100 mL/hr over 60 Minutes Intravenous Every 8 hours 10/02/22 0946 10/09/22 0959   10/02/22 0815  vancomycin (VANCOCIN) IVPB 1000 mg/200 mL premix        1,000 mg 200 mL/hr over 60 Minutes Intravenous  Once 10/02/22 0802 10/02/22 1055       Assessment/Plan: s/p Procedure(s): IRRIGATION AND DEBRIDEMENT SOFT TISSUE AND BONE RIGHT FOOT (Right) Assessment: Stable status post I&D right foot  Plan: Betadine gauze wet-to-dry  dressing reapplied to the foot.  Instructed the patient to continue nonweightbearing on the right foot.  Continue to elevate.  Currently waiting on cultures.  I think the patient should be stable for discharge was then the next day or 2.  LOS: 3 days    Ricci Barker 10/05/2022

## 2022-10-05 NOTE — Progress Notes (Signed)
  PROGRESS NOTE    Jacob Love  QPY:[id number] DOB: [birth year] DOA: 10/02/2022 [facility]CP: [facility]  136A/136A-AA  LOS: 3 days   Brief hospital course:   Assessment & Plan: Jacob Love is a 41 y.o. male with medical history significant for morbid obesity (BMI 45), hypertension, history of Mnire's disease, history of neuropathic foot ulcer, recent admission for severe sepsis from osteomyelitis status post right partial first ray amputation and right second metatarsal head bone biopsy on 08/26/22 and was discharged on antibiotic therapy to follow-up with podiatry as an outpatient.  Continues to have a chronic open wound at the surgical site and recently seen outpatient with the wound probing down to the level of the bone with x-ray changes consistent with continued osteomyelitis. Decision was made for hospitalization and revision of his surgery.   * Osteomyelitis of ankle or foot, acute, right (HCC) Full-thickness ulceration with abscess right foot  S/p surgical I/D of right foot on 12/9 --cont vanc, ceftriaxone and flagyl --nonweightbearing on the right foot  --f/u surgical cx  Essential hypertension Continue hydrochlorothiazide   Morbid obesity with BMI of 45.0-49.9, adult (HCC) BMI 45.1 Complicates overall prognosis and care Lifestyle modification and exercise has been discussed with patient in detail   Hypokalemia --monitor and replete PRN  Prediabetes --recent A1c 5.9    DVT prophylaxis: Lovenox SQ Code Status: Full code  Family Communication: mother updated at bedside today Level of care: Med-Surg Dispo:   The patient is from: home Anticipated d/c is to: home Anticipated d/c date is: 2-3 days   Subjective and Interval History:  No new issues.   Objective: Vitals:   10/05/22 0024 10/05/22 0450 10/05/22 0730 10/05/22 1659  BP: 112/62 (!) 113/47 135/80 (!) 129/91  Pulse: 77 70 64 75  Resp: 18 18 18 16   Temp: 98.2 F (36.8 C) 98 F (36.7  C) 97.8 F (36.6 C) 98.4 F (36.9 C)  TempSrc:   Oral   SpO2: 96% 95% 97% 98%  Weight:      Height:        Intake/Output Summary (Last 24 hours) at 10/05/2022 2001 Last data filed at 10/05/2022 1702 Gross per 24 hour  Intake 340 ml  Output 3225 ml  Net -2885 ml   Filed Weights   10/02/22 0744  Weight: (!) 163.7 kg    Examination:   Constitutional: NAD, AAOx3 HEENT: conjunctivae and lids normal, EOMI CV: No cyanosis.   RESP: normal respiratory effort, on RA Extremities: right foot wrapped and in surgical boot Neuro: II - XII grossly intact.   Psych: Normal mood and affect.  Appropriate judgement and reason   Data Reviewed: I have personally reviewed labs and imaging studies  Time spent: 25 minutes  14/08/23, MD [facility] If 7PM-7AM, please contact night-coverage 10/05/2022, 8:01 PM

## 2022-10-05 NOTE — Consult Note (Signed)
Pharmacy Antibiotic Note  Jacob Love is a 41 y.o. male admitted on 10/02/2022 with osteomyelitis.  Pharmacy has been consulted for vancomycin dosing. S/p I&D of right foot with implantation of vancomycin beads.   Patient was started on Vancomycin 2000 mg IV loading dose, followed by 1500 mg IV q8h.  12/11 13:30 Vancomycin trough 19 mcg/ml  Plan: Will change to Vancomycin 2000 mg IV q12h  Monitor clinical picture, renal function, and vancomycin levels at steady state F/U C&S, abx deescalation / LOT   Height: 6\' 3"  (190.5 cm) Weight: (!) 163.7 kg (360 lb 14.3 oz) IBW/kg (Calculated) : 84.5  Temp (24hrs), Avg:98.2 F (36.8 C), Min:97.8 F (36.6 C), Max:98.7 F (37.1 C)  Recent Labs  Lab 10/02/22 0852 10/03/22 0432 10/04/22 0415 10/05/22 0543 10/05/22 1317  WBC 8.6 8.6 8.5 9.5  --   CREATININE 0.75 0.83 0.76 0.65  --   VANCOTROUGH  --   --   --   --  19     Estimated Creatinine Clearance: 195.7 mL/min (by C-G formula based on SCr of 0.65 mg/dL).    No Known Allergies  Antimicrobials this admission: Vancomycin 12/8 >>  Ceftriaxone 12/8 >>  Flagyl 12/8 >>   Dose adjustments this admission: 12/11 Vancomycin changed from 1500 mg q8h to 2000 mg q12h  Microbiology results: 12/8 BCx: Pending 12/9 abscess Cx: pending   Thank you for allowing pharmacy to be a part of this patient's care.  14/9, PharmD, BCPS 10/05/2022 2:32 PM

## 2022-10-05 NOTE — Progress Notes (Signed)
Received call form [facility] pts bone cultures growing gram positive rods

## 2022-10-05 NOTE — Plan of Care (Signed)

## 2022-10-06 LAB — BASIC METABOLIC PANEL
Anion gap: 6 (ref 5–15)
BUN: 11 mg/dL (ref 6–20)
CO2: 25 mmol/L (ref 22–32)
Calcium: 9.2 mg/dL (ref 8.9–10.3)
Chloride: 104 mmol/L (ref 98–111)
Creatinine, Ser: 0.84 mg/dL (ref 0.61–1.24)
GFR, Estimated: >60 mL/min (ref >60)
Glucose, Bld: 128 mg/dL — ABNORMAL HIGH (ref 70–99)
Potassium: 3.9 mmol/L (ref 3.5–5.1)
Sodium: 135 mmol/L (ref 135–145)

## 2022-10-06 LAB — CBC
HCT: 40.3 % (ref 39.0–52.0)
Hemoglobin: 12.8 g/dL — ABNORMAL LOW (ref 13.0–17.0)
MCH: 27.7 pg (ref 26.0–34.0)
MCHC: 31.8 g/dL (ref 30.0–36.0)
MCV: 87.2 fL (ref 80.0–100.0)
Platelets: 271 10*3/uL (ref 150–400)
RBC: 4.62 MIL/uL (ref 4.22–5.81)
RDW: 15.3 % (ref 11.5–15.5)
WBC: 11.3 10*3/uL — ABNORMAL HIGH (ref 4.0–10.5)
nRBC: 0 % (ref 0.0–0.2)

## 2022-10-06 LAB — SURGICAL PATHOLOGY

## 2022-10-06 LAB — MAGNESIUM: Magnesium: 2.2 mg/dL (ref 1.7–2.4)

## 2022-10-06 MED ORDER — SODIUM CHLORIDE 0.9 % IV SOLN
2.0000 g | INTRAVENOUS | Status: DC
  Administered 2022-10-07: 2 g via INTRAVENOUS
  Filled 2022-10-06: qty 2

## 2022-10-06 MED ORDER — METRONIDAZOLE 500 MG/100ML IV SOLN
500.0000 mg | Freq: Three times a day (TID) | INTRAVENOUS | Status: DC
  Filled 2022-10-06 (×2): qty 100

## 2022-10-06 MED ORDER — ORAL CARE MOUTH RINSE: 15.0000 mL | OROMUCOSAL | Status: DC | PRN

## 2022-10-06 NOTE — Progress Notes (Signed)
3 Days Post-Op   Subjective/Chief Complaint: Patient seen.  No complaints.   Objective: Vital signs in last 24 hours: Temp:  [98.2 F (36.8 C)-98.4 F (36.9 C)] 98.2 F (36.8 C) (12/12 0819) Pulse Rate:  [74-85] 74 (12/12 0819) Resp:  [16] 16 (12/12 0051) BP: (125-129)/(82-91) 127/82 (12/12 0819) SpO2:  [97 %-98 %] 97 % (12/12 0819) Last BM Date : 10/05/22  Intake/Output from previous day: 12/11 0701 - 12/12 0700 In: 1036.9 [P.O.:240; IV Piggyback:796.9] Out: 3300 [Urine:3300] Intake/Output this shift: No intake/output data recorded.  Still some moderate bleeding and drainage on the bandaging but not as much as prior days.  Incision is well coapted and appears stable.  Lab Results:  Recent Labs    10/05/22 0543 10/06/22 0146  WBC 9.5 11.3*  HGB 13.2 12.8*  HCT 42.0 40.3  PLT 256 271   BMET Recent Labs    10/05/22 0543 10/06/22 0146  NA 137 135  K 4.1 3.9  CL 107 104  CO2 25 25  GLUCOSE 123* 128*  BUN 11 11  CREATININE 0.65 0.84  CALCIUM 9.0 9.2   PT/INR No results for input(s): "LABPROT", "INR" in the last 72 hours. ABG No results for input(s): "PHART", "HCO3" in the last 72 hours.  Invalid input(s): "PCO2", "PO2"  Studies/Results: DG Foot Complete Right  Result Date: 10/04/2022 CLINICAL DATA:  Status post incision and debridement of right with placement of antibiotic impregnated beads. EXAM: RIGHT FOOT COMPLETE - 3 VIEW COMPARISON:  Right foot radiographs dated 08/26/2022 FINDINGS: Postsurgical changes from incision and debridement of right foot with further proximal amputation of the first metatarsal. Rounded radiodensities correspond to antibiotic impregnated beads. IMPRESSION: Postsurgical changes from incision and debridement of right foot with further proximal amputation of the first metatarsal. Rounded radiodensities correspond to antibiotic impregnated beads. Electronically Signed   By: Agustin Cree M.D.   On: 10/04/2022 15:15     Anti-infectives: Anti-infectives (From admission, onward)    Start     Dose/Rate Route Frequency Ordered Stop   10/06/22 0000  vancomycin (VANCOREADY) IVPB 2000 mg/400 mL        2,000 mg 200 mL/hr over 120 Minutes Intravenous Every 12 hours 10/05/22 1429     10/04/22 0500  vancomycin (VANCOREADY) IVPB 1500 mg/300 mL  Status:  Discontinued        1,500 mg 150 mL/hr over 120 Minutes Intravenous Every 8 hours 10/03/22 2040 10/05/22 1429   10/03/22 2100  vancomycin (VANCOREADY) IVPB 2000 mg/400 mL        2,000 mg 200 mL/hr over 120 Minutes Intravenous  Once 10/03/22 2001 10/03/22 2312   10/03/22 0839  vancomycin (VANCOCIN) powder  Status:  Discontinued          As needed 10/03/22 0840 10/03/22 0859   10/02/22 1000  cefTRIAXone (ROCEPHIN) 2 g in sodium chloride 0.9 % 100 mL IVPB       See Hyperspace for full Linked Orders Report.   2 g 200 mL/hr over 30 Minutes Intravenous Every 24 hours 10/02/22 0946 10/09/22 0959   10/02/22 1000  metroNIDAZOLE (FLAGYL) IVPB 500 mg       See Hyperspace for full Linked Orders Report.   500 mg 100 mL/hr over 60 Minutes Intravenous Every 8 hours 10/02/22 0946 10/09/22 0959   10/02/22 0815  vancomycin (VANCOCIN) IVPB 1000 mg/200 mL premix        1,000 mg 200 mL/hr over 60 Minutes Intravenous  Once 10/02/22 0802 10/02/22 1055  Assessment/Plan: s/p Procedure(s): IRRIGATION AND DEBRIDEMENT SOFT TISSUE AND BONE RIGHT FOOT (Right) Assessment: Status post I&D right foot, stable.  Plan: Betadine gauze reapplied to the incision followed by a bulky gauze bandage.  Patient should be stable for discharge from podiatry standpoint but was hoping to wait until pathology and cultures returned.  If signs of osteo at the cut end of the bone consideration may need to be given for IV antibiotics with PICC line.  Patient instructed to continue nonweightbearing on the right foot.  I will plan for follow-up next week for reevaluation outpatient.  LOS: 4 days     Ricci Barker 10/06/2022

## 2022-10-06 NOTE — Progress Notes (Signed)
Nutrition Follow-up  DOCUMENTATION CODES:   Obesity unspecified  INTERVENTION:   -Continue MVI with minerals daily -Continue 30 ml Prosource Plus BID, each supplement provides 100 kcals and 15 grams protein -Continue 1 packet Juven BID, each packet provides 95 calories, 2.5 grams of protein (collagen), and 9.8 grams of carbohydrate (3 grams sugar); also contains 7 grams of L-arginine and L-glutamine, 300 mg vitamin C, 15 mg vitamin E, 1.2 mcg vitamin B-12, 9.5 mg zinc, 200 mg calcium, and 1.5 g  Calcium Beta-hydroxy-Beta-methylbutyrate to support wound healing   NUTRITION DIAGNOSIS:   Increased nutrient needs related to wound healing as evidenced by estimated needs.  Ongoing  GOAL:   Patient will meet greater than or equal to 90% of their needs  Progressing   MONITOR:   PO intake, Supplement acceptance, Skin, Labs  REASON FOR ASSESSMENT:   Consult Wound healing  ASSESSMENT:   41 y.o. male presented to the ED for evaluation of R foot for possible infection. 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 PMH includes HTN and Meniere disease. Pt admitted with osteomyelitis of R foot.  12/9- s/p Procedure: 1.  I&D infected soft tissue and bone right foot. 2.  Rotational skin closure right foot.   Reviewed I/O's: -2.3 L x 24 hours and -3.9 L since admission  UOP: 3.3 L x 24 hours  Pt sitting up in bed watching Tick Tok videos at time of visit. He reports feeling well and consuming 100% of meals. He admits that he sometimes forgets to call down to order his meals. He eats 100% of meals and also eats food provided by family.   Discussed importance of good meal and supplement intake to promote healing. Pt amenable to continue supplements. Per pt, he may go home today.   Lab Results  Component Value Date   HGBA1C 5.9 (H) 08/26/2022   PTA DM medications are none.   Labs reviewed.   NUTRITION - FOCUSED PHYSICAL EXAM:  Flowsheet Row Most Recent Value  Orbital Region No  depletion  Upper Arm Region No depletion  Thoracic and Lumbar Region No depletion  Buccal Region No depletion  Temple Region No depletion  Clavicle Bone Region No depletion  Clavicle and Acromion Bone Region No depletion  Scapular Bone Region No depletion  Dorsal Hand No depletion  Patellar Region No depletion  Anterior Thigh Region No depletion  Posterior Calf Region No depletion  Edema (RD Assessment) Mild  Hair Reviewed  Eyes Reviewed  Mouth Reviewed  Skin Reviewed  Nails Reviewed       Diet Order:   Diet Order             Diet regular Room service appropriate? Yes; Fluid consistency: Thin  Diet effective now                   EDUCATION NEEDS:   No education needs have been identified at this time  Skin:  Skin Assessment: Skin Integrity Issues: Skin Integrity Issues:: Incisions Incisions: closed lt foot  Last BM:  10/05/22  Height:   Ht Readings from Last 1 Encounters:  10/02/22 6\' 3"  (1.905 m)    Weight:   Wt Readings from Last 1 Encounters:  10/02/22 (!) 163.7 kg    Ideal Body Weight:  89.1 kg  BMI:  Body mass index is 45.11 kg/m.  Estimated Nutritional Needs:   Kcal:  2500-2700  Protein:  130-145 grams  Fluid:  > 2 L    Kamel Haven W, RD,  LDN, [place] Registered Dietitian II Certified Diabetes Care and Education Specialist Please refer to [facility] for RD and/or RD on-call/weekend/after hours pager

## 2022-10-06 NOTE — Progress Notes (Signed)
  PROGRESS NOTE    Phu Record Morrissette  KDX:[id number] DOB: [birth year] DOA: 10/02/2022 PCP: [facility]  150A/150A-AA  LOS: 4 days   Brief hospital course:   Assessment & Plan: Jacob Love is a 41 y.o. male with medical history significant for morbid obesity (BMI 45), hypertension, history of Mnire's disease, history of neuropathic foot ulcer, recent admission for severe sepsis from osteomyelitis status post right partial first ray amputation and right second metatarsal head bone biopsy on 08/26/22 and was discharged on antibiotic therapy to follow-up with podiatry as an outpatient.  Continues to have a chronic open wound at the surgical site and recently seen outpatient with the wound probing down to the level of the bone with x-ray changes consistent with continued osteomyelitis. Decision was made for hospitalization and revision of his surgery.   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 Plan: --cont vanc, ceftriaxone and flagyl --nonweightbearing on the right foot  --ID consult today  Essential hypertension Continue hydrochlorothiazide   Morbid obesity with BMI of 45.0-49.9, adult (HCC) BMI 45.1 Complicates overall prognosis and care Lifestyle modification and exercise has been discussed with patient in detail   Hypokalemia --monitor and replete PRN  Prediabetes --recent A1c 5.9    DVT prophylaxis: Lovenox SQ Code Status: Full code  Family Communication:  Level of care: Med-Surg Dispo:   The patient is from: home Anticipated d/c is to: home Anticipated d/c date is: undetermined.  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  ID  consult today.   Objective: Vitals:   10/05/22 1659 10/06/22 0051 10/06/22 0819 10/06/22 1557  BP: (!) 129/91 125/85 127/82 (!) 114/92  Pulse: 75 85 74 79  Resp: 16 16    Temp: 98.4 F (36.9 C) 98.3 F (36.8 C) 98.2 F (36.8 C) 97.6 F (36.4 C)  TempSrc:      SpO2: 98% 97% 97% 98%  Weight:      Height:        Intake/Output Summary (Last 24 hours) at 10/06/2022 1922 Last data filed at 10/06/2022 1303 Gross per 24 hour  Intake 1096.94 ml  Output 2950 ml  Net -1853.06 ml   Filed Weights   10/02/22 0744  Weight: (!) 163.7 kg    Examination:   Constitutional: NAD, AAOx3 HEENT: conjunctivae and lids normal, EOMI CV: No cyanosis.   RESP: normal respiratory effort, on RA Extremities: right foot wrapped and in surgical boot Neuro: II - XII grossly intact.   Psych: Normal mood and affect.  Appropriate judgement and reason   Data Reviewed: I have personally reviewed labs and imaging studies  Time spent: 35 minutes  Darlin Priestly, MD [facility] If 7PM-7AM, please contact night-coverage 10/06/2022, 7:22 PM

## 2022-10-06 NOTE — Consult Note (Signed)
NAME: Jacob Love  DOB: Mar 24, 1981  MRN: [id number]  Date/Time: 10/06/2022 3:32 PM  REQUESTING PROVIDER: Dr.Lai Subjective:  REASON FOR CONSULT: foot infection ? Jacob Love is a 41 y.o. 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 He has had a callus/wound for more than a year and as it was getting worse he underwent first ray parital ampoutation on 08/26/22 and biopsy of 2nd toe- both showed acute osteomyelitis . Culture was reported as multiple organisms- HE was discharged on PO bactrim and Augmentin HE underwent further surgery with I/d and debridement of the rt met and it is reported as chronic osteo. I am asked to see the patient for further management He is currently on vanco and ceftriaxone  Past Medical History:  Diagnosis Date   Hypertension    Meniere disease    Morbid obesity [facility])     Past Surgical History:  Procedure Laterality Date   AMPUTATION Right 08/26/2022   Procedure: RIGHT PARTIAL 1ST RAY AMPUTATION;  Surgeon: Caroline More, DPM;  Location: [facility] ORS;  Service: Podiatry;  Laterality: Right;   INCISION AND DRAINAGE OF WOUND Right 10/03/2022   Procedure: IRRIGATION AND DEBRIDEMENT SOFT TISSUE AND BONE RIGHT FOOT;  Surgeon: Sharlotte Alamo, DPM;  Location: [facility] ORS;  Service: Podiatry;  Laterality: Right;   NO PAST SURGERIES      Social History   Socioeconomic History   Marital status: Divorced    Spouse name: Not on file   Number of children: 1   Years of education: Not on file   Highest education level: Not on file  Occupational History   Not on file  Tobacco Use   Smoking status: Every Day    Packs/day: 1.00    Types: Cigarettes   Smokeless tobacco: Current    Types: Snuff  Vaping Use   Vaping Use: Former  Substance and Sexual Activity   Alcohol use: Yes   Drug use: Never   Sexual activity: Not on file   Other Topics Concern   Not on file  Social History Narrative   Not on file   Social Determinants of Health   Financial Resource Strain: Not on file  Food Insecurity: Food Insecurity Present (10/02/2022)   Hunger Vital Sign    Worried About [place] in the Last Year: Sometimes true    Ran Out of Food in the Last Year: Sometimes true  Transportation Needs: No Transportation Needs (10/02/2022)   PRAPARE - Hydrologist (Medical): No    Lack of Transportation (Non-Medical): No  Physical Activity: Not on file  Stress: Not on file  Social Connections: Not on file  Intimate Partner Violence: Not At Risk (10/02/2022)   Humiliation, Afraid, Rape, and Kick questionnaire    Fear of Current or Ex-Partner: No    Emotionally Abused: No    Physically Abused: No    Sexually Abused: No    History reviewed. No pertinent family history. No Known Allergies I? Current Facility-Administered Medications  Medication Dose Route Frequency Provider Last Rate Last Admin   (feeding supplement) PROSource Plus liquid 30 mL  30 mL Oral BID BM Enzo Bi, MD   30 mL at 10/06/22 1303   0.9 %  sodium chloride infusion  250 mL Intravenous PRN Sharlotte Alamo, DPM       cefTRIAXone (ROCEPHIN) 2  g in sodium chloride 0.9 % 100 mL IVPB  2 g Intravenous Q24H Sharlotte Alamo, DPM 200 mL/hr at 10/06/22 1037 2 g at 10/06/22 1037   And   metroNIDAZOLE (FLAGYL) IVPB 500 mg  500 mg Intravenous Q8H Sharlotte Alamo, DPM   Stopped at 10/06/22 1030   enoxaparin (LOVENOX) injection 80 mg  80 mg Subcutaneous Q24H Enzo Bi, MD   80 mg at 10/05/22 2218   hydrochlorothiazide (HYDRODIURIL) tablet 12.5 mg  12.5 mg Oral Daily Sharlotte Alamo, DPM   12.5 mg at 10/06/22 0931   HYDROcodone-acetaminophen (NORCO/VICODIN) 5-325 MG per tablet 1-2 tablet  1-2 tablet Oral Q4H PRN Sharlotte Alamo, DPM   1 tablet at 10/03/22 2103   morphine (PF) 2 MG/ML injection 2 mg  2 mg Intravenous Q2H PRN Sharlotte Alamo, DPM       multivitamin  with minerals tablet 1 tablet  1 tablet Oral Daily Enzo Bi, MD   1 tablet at 10/06/22 9892   nutrition supplement (JUVEN) (JUVEN) powder packet 1 packet  1 packet Oral BID BM Enzo Bi, MD   1 packet at 10/06/22 1303   ondansetron (ZOFRAN) tablet 4 mg  4 mg Oral Q6H PRN Sharlotte Alamo, DPM       Or   ondansetron [facility]) injection 4 mg  4 mg Intravenous Q6H PRN Sharlotte Alamo, DPM       Oral care mouth rinse  15 mL Mouth Rinse PRN Enzo Bi, MD       sodium chloride flush (NS) 0.9 % injection 3 mL  3 mL Intravenous Q12H Sharlotte Alamo, DPM   3 mL at 10/06/22 0933   sodium chloride flush (NS) 0.9 % injection 3 mL  3 mL Intravenous PRN Sharlotte Alamo, DPM       vancomycin (VANCOREADY) IVPB 2000 mg/400 mL  2,000 mg Intravenous Q12H Vira Blanco, RPH 200 mL/hr at 10/06/22 1137 2,000 mg at 10/06/22 1137     Abtx:  Anti-infectives (From admission, onward)    Start     Dose/Rate Route Frequency Ordered Stop   10/06/22 0000  vancomycin (VANCOREADY) IVPB 2000 mg/400 mL        2,000 mg 200 mL/hr over 120 Minutes Intravenous Every 12 hours 10/05/22 1429     10/04/22 0500  vancomycin (VANCOREADY) IVPB 1500 mg/300 mL  Status:  Discontinued        1,500 mg 150 mL/hr over 120 Minutes Intravenous Every 8 hours 10/03/22 2040 10/05/22 1429   10/03/22 2100  vancomycin (VANCOREADY) IVPB 2000 mg/400 mL        2,000 mg 200 mL/hr over 120 Minutes Intravenous  Once 10/03/22 2001 10/03/22 2312   10/03/22 0839  vancomycin (VANCOCIN) powder  Status:  Discontinued          As needed 10/03/22 0840 10/03/22 0859   10/02/22 1000  cefTRIAXone (ROCEPHIN) 2 g in sodium chloride 0.9 % 100 mL IVPB       See Hyperspace for full Linked Orders Report.   2 g 200 mL/hr over 30 Minutes Intravenous Every 24 hours 10/02/22 0946 10/09/22 0959   10/02/22 1000  metroNIDAZOLE (FLAGYL) IVPB 500 mg       See Hyperspace for full Linked Orders Report.   500 mg 100 mL/hr over 60 Minutes Intravenous Every 8 hours 10/02/22 0946 10/09/22 0959    10/02/22 0815  vancomycin (VANCOCIN) IVPB 1000 mg/200 mL premix        1,000 mg 200 mL/hr over 60 Minutes Intravenous  Once  10/02/22 0802 10/02/22 1055       REVIEW OF SYSTEMS:  Const: negative fever, negative chills, negative weight loss Eyes: negative diplopia or visual changes, negative eye pain ENT: negative coryza, negative sore throat Resp: negative cough, hemoptysis, dyspnea Cards: negative for chest pain, palpitations, lower extremity edema GU: negative for frequency, dysuria and hematuria GI: Negative for abdominal pain, diarrhea, bleeding, constipation Skin: negative for rash and pruritus Heme: negative for easy bruising and gum/nose bleeding MS: foot pain Neurolo:negative for headaches, dizziness, vertigo, memory problems  Psych: negative for feelings of anxiety, depression  Endocrine: negative for thyroid, borderline diabetes Allergy/Immunology- negative for any medication or food allergies  Objective:  VITALS:  BP 127/82 (BP Location: Right Arm)   Pulse 74   Temp 98.2 F (36.8 C)   Resp 16   Ht _0  (1.905 m)   Wt (!) 163.7 kg   SpO2 97%   BMI 45.11 kg/m   PHYSICAL EXAM:  General: Alert, cooperative, no distress, appears stated age.  Head: Normocephalic, without obvious abnormality, atraumatic. Eyes: Conjunctivae clear, anicteric sclerae. Pupils are equal ENT Nares normal. No drainage or sinus tenderness. Lips, mucosa, and tongue normal. No Thrush Neck: Supple, symmetrical, no adenopathy, thyroid: non tender no carotid bruit and no JVD. Back: No CVA tenderness. Lungs: Clear to auscultation bilaterally. No Wheezing or Rhonchi. No rales. Heart: Regular rate and rhythm, no murmur, rub or gallop. Abdomen: Soft, non-tender,not distended. Bowel sounds normal. No masses Extremities: rt foot- some swelling- rt great toe amputated  Skin: No rashes or lesions. Or bruising Lymph: Cervical, supraclavicular normal. Neurologic: Grossly non-focal Pertinent  Labs Lab Results CBC    Component Value Date/Time   WBC 11.3 (H) 10/06/2022 0146   RBC 4.62 10/06/2022 0146   HGB 12.8 (L) 10/06/2022 0146   HGB 15.6 06/21/2014 1851   HCT 40.3 10/06/2022 0146   HCT 47.5 06/21/2014 1851   PLT 271 10/06/2022 0146   PLT 208 06/21/2014 1851   MCV 87.2 10/06/2022 0146   MCV 88 06/21/2014 1851   MCH 27.7 10/06/2022 0146   MCHC 31.8 10/06/2022 0146   RDW 15.3 10/06/2022 0146   RDW 14.3 06/21/2014 1851   LYMPHSABS 1.7 08/24/2022 1852   LYMPHSABS 1.2 06/21/2014 1851   MONOABS 1.6 (H) 08/24/2022 1852   MONOABS 0.4 06/21/2014 1851   EOSABS 0.0 08/24/2022 1852   EOSABS 0.1 06/21/2014 1851   BASOSABS 0.1 08/24/2022 1852   BASOSABS 0.1 06/21/2014 1851       Latest Ref Rng & Units 10/06/2022    1:46 AM 10/05/2022    5:43 AM 10/04/2022    4:15 AM  CMP  Glucose 70 - 99 mg/dL 128  123  122   BUN 6 - 20 mg/dL _1 Creatinine 0.61 - 1.24 mg/dL 0.84  0.65  0.76   Sodium 135 - 145 mmol/L 135  137  135   Potassium 3.5 - 5.1 mmol/L 3.9  4.1  3.7   Chloride 98 - 111 mmol/L 104  107  105   CO2 22 - 32 mmol/L _2 Calcium 8.9 - 10.3 mg/dL 9.2  9.0  8.7       Microbiology: Recent Results (from the past 240 hour(s))  Blood culture (single)     Status: None (Preliminary result)   Collection Time: 10/02/22  8:52 AM   Specimen: BLOOD  Result Value Ref Range Status   Specimen Description BLOOD BLOOD LEFT  HAND  Final   Special Requests   Final    BOTTLES DRAWN AEROBIC AND ANAEROBIC Blood Culture adequate volume   Culture   Final    NO GROWTH 3 DAYS Performed at [facility], [address], [place], [place] [address]    Report Status PENDING  Incomplete  Surgical PCR screen     Status: None   Collection Time: 10/03/22  6:21 AM   Specimen: Nasal Mucosa; Nasal Swab  Result Value Ref Range Status   MRSA, PCR NEGATIVE NEGATIVE Final   Staphylococcus aureus NEGATIVE NEGATIVE Final    Comment: (NOTE) The Xpert SA Assay (FDA  approved for NASAL specimens in patients 31 years of age and older), is one component of a comprehensive surveillance program. It is not intended to diagnose infection nor to guide or monitor treatment. Performed at [facility], [address] [place], [place] [address]   Aerobic/Anaerobic Culture w Gram Stain (surgical/deep wound)     Status: None (Preliminary result)   Collection Time: 10/03/22  8:21 AM   Specimen: Abscess  Result Value Ref Range Status   Specimen Description   Final    ABSCESS Performed at [facility], [address], [place], [place] [address]    Special Requests RIGHT FOOT  Final   Gram Stain   Final    NO WBC SEEN NO ORGANISMS SEEN Performed at [facility], [place] [address] [place], [place] [address]    Culture   Final    CULTURE REINCUBATED FOR BETTER GROWTH NO ANAEROBES ISOLATED; CULTURE IN PROGRESS FOR 5 DAYS    Report Status PENDING  Incomplete  Aerobic/Anaerobic Culture w Gram Stain (surgical/deep wound)     Status: None (Preliminary result)   Collection Time: 10/03/22  8:25 AM   Specimen: PATH Bone biopsy; Tissue  Result Value Ref Range Status   Specimen Description BONE  Final   Special Requests RIGHT 1ST METATARSAL  Final   Gram Stain   Final    NO ORGANISMS SEEN NO WBC SEEN Performed at [facility], [address], [place], [place] [address]    Culture   Final    RARE GRAM POSITIVE RODS IDENTIFICATION TO FOLLOW CRITICAL RESULT CALLED TO, READ BACK BY AND VERIFIED WITH: RN ANNA.R AT 1433 ON 10/05/2022 BY T.SAAD. NO ANAEROBES ISOLATED; CULTURE IN PROGRESS FOR 5 DAYS    Report Status PENDING  Incomplete    IMAGING RESULTS:  I have personally reviewed the films ?rt great toe amputated  Impression/Recommendation ?Rt great toe infection s/p partial ray amputation on 11/1 followed by further debridement this admission Chronic osteo in the bone. Will discuss with Dr.Cline to determine if proximal margin  of the bone was sent.to look for clearance Depending on that and bone culture will decide on antibiotic route and duration Continue ce  Discussed the management with patient in great detail   ___________________________________________________  Note:  This document was prepared using Dragon voice recognition software and may include unintentional dictation errors.

## 2022-10-06 NOTE — Consult Note (Incomplete)
NAME: Jacob Love  DOB: 15-Feb-1981  MRN: [id number]  Date/Time: 10/06/2022 3:32 PM  REQUESTING PROVIDER: Dr.Lai Subjective:  REASON FOR CONSULT: foot infection   Jacob Love is a 41 y.o. 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 He has had a callus/wound for more than a year and as it was getting worse he underwent first ray parital ampoutation on 08/26/22 and biopsy of 2nd toe- both showed acute osteomyelitis . Culture was reported as multiple organisms- HE was discharged on PO bactrim and Augmentin HE underwent further surgery with I/d and debridement of the rt met and it is reported as chronic osteo. I am asked to see the patient for further management He is currently on vanco and ceftriaxone  Past Medical History:  Diagnosis Date  . Hypertension   . Meniere disease   . Morbid obesity ([place])     Past Surgical History:  Procedure Laterality Date  . AMPUTATION Right 08/26/2022   Procedure: RIGHT PARTIAL 1ST RAY AMPUTATION;  Surgeon: Caroline More, DPM;  Location: [facility] ORS;  Service: Podiatry;  Laterality: Right;  . INCISION AND DRAINAGE OF WOUND Right 10/03/2022   Procedure: IRRIGATION AND DEBRIDEMENT SOFT TISSUE AND BONE RIGHT FOOT;  Surgeon: Sharlotte Alamo, DPM;  Location: [facility] ORS;  Service: Podiatry;  Laterality: Right;  . NO PAST SURGERIES      Social History   Socioeconomic History  . Marital status: Divorced    Spouse name: Not on file  . Number of children: 1  . Years of education: Not on file  . Highest education level: Not on file  Occupational History  . Not on file  Tobacco Use  . Smoking status: Every Day    Packs/day: 1.00    Types: Cigarettes  . Smokeless tobacco: Current    Types: Snuff  Vaping Use  . Vaping Use: Former  Substance and Sexual Activity  . Alcohol use: Yes  . Drug use: Never  . Sexual  activity: Not on file  Other Topics Concern  . Not on file  Social History Narrative  . Not on file   Social Determinants of Health   Financial Resource Strain: Not on file  Food Insecurity: Food Insecurity Present (10/02/2022)   Hunger Vital Sign   . Worried About Charity fundraiser in the Last Year: Sometimes true   . Ran Out of Food in the Last Year: Sometimes true  Transportation Needs: No Transportation Needs (10/02/2022)   PRAPARE - Transportation   . Lack of Transportation (Medical): No   . Lack of Transportation (Non-Medical): No  Physical Activity: Not on file  Stress: Not on file  Social Connections: Not on file  Intimate Partner Violence: Not At Risk (10/02/2022)   Humiliation, Afraid, Rape, and Kick questionnaire   . Fear of Current or Ex-Partner: No   . Emotionally Abused: No   . Physically Abused: No   . Sexually Abused: No    History reviewed. No pertinent family history. No Known Allergies I  Current Facility-Administered Medications  Medication Dose Route Frequency Provider Last Rate Last Admin  . (feeding supplement) PROSource Plus liquid 30 mL  30 mL Oral BID BM Enzo Bi, MD   30 mL at 10/06/22 1303  . 0.9 %  sodium chloride infusion  250 mL Intravenous PRN Sharlotte Alamo, DPM      . cefTRIAXone (  ROCEPHIN) 2 g in sodium chloride 0.9 % 100 mL IVPB  2 g Intravenous Q24H Sharlotte Alamo, DPM 200 mL/hr at 10/06/22 1037 2 g at 10/06/22 1037   And  . metroNIDAZOLE (FLAGYL) IVPB 500 mg  500 mg Intravenous Q8H Sharlotte Alamo, DPM   Stopped at 10/06/22 1030  . enoxaparin (LOVENOX) injection 80 mg  80 mg Subcutaneous Q24H Enzo Bi, MD   80 mg at 10/05/22 2218  . hydrochlorothiazide (HYDRODIURIL) tablet 12.5 mg  12.5 mg Oral Daily Sharlotte Alamo, DPM   12.5 mg at 10/06/22 0931  . HYDROcodone-acetaminophen (NORCO/VICODIN) 5-325 MG per tablet 1-2 tablet  1-2 tablet Oral Q4H PRN Sharlotte Alamo, DPM   1 tablet at 10/03/22 2103  . morphine (PF) 2 MG/ML injection 2 mg  2 mg Intravenous Q2H  PRN Sharlotte Alamo, DPM      . multivitamin with minerals tablet 1 tablet  1 tablet Oral Daily Enzo Bi, MD   1 tablet at 10/06/22 0931  . nutrition supplement (JUVEN) (JUVEN) powder packet 1 packet  1 packet Oral BID BM Enzo Bi, MD   1 packet at 10/06/22 1303  . ondansetron (ZOFRAN) tablet 4 mg  4 mg Oral Q6H PRN Sharlotte Alamo, DPM       Or  . ondansetron [facility]) injection 4 mg  4 mg Intravenous Q6H PRN Sharlotte Alamo, DPM      . Oral care mouth rinse  15 mL Mouth Rinse PRN Enzo Bi, MD      . sodium chloride flush (NS) 0.9 % injection 3 mL  3 mL Intravenous Q12H Sharlotte Alamo, DPM   3 mL at 10/06/22 0933  . sodium chloride flush (NS) 0.9 % injection 3 mL  3 mL Intravenous PRN Sharlotte Alamo, DPM      . vancomycin (VANCOREADY) IVPB 2000 mg/400 mL  2,000 mg Intravenous Q12H Vira Blanco, RPH 200 mL/hr at 10/06/22 1137 2,000 mg at 10/06/22 1137     Abtx:  Anti-infectives (From admission, onward)    Start     Dose/Rate Route Frequency Ordered Stop   10/06/22 0000  vancomycin (VANCOREADY) IVPB 2000 mg/400 mL        2,000 mg 200 mL/hr over 120 Minutes Intravenous Every 12 hours 10/05/22 1429     10/04/22 0500  vancomycin (VANCOREADY) IVPB 1500 mg/300 mL  Status:  Discontinued        1,500 mg 150 mL/hr over 120 Minutes Intravenous Every 8 hours 10/03/22 2040 10/05/22 1429   10/03/22 2100  vancomycin (VANCOREADY) IVPB 2000 mg/400 mL        2,000 mg 200 mL/hr over 120 Minutes Intravenous  Once 10/03/22 2001 10/03/22 2312   10/03/22 0839  vancomycin (VANCOCIN) powder  Status:  Discontinued          As needed 10/03/22 0840 10/03/22 0859   10/02/22 1000  cefTRIAXone (ROCEPHIN) 2 g in sodium chloride 0.9 % 100 mL IVPB       See Hyperspace for full Linked Orders Report.   2 g 200 mL/hr over 30 Minutes Intravenous Every 24 hours 10/02/22 0946 10/09/22 0959   10/02/22 1000  metroNIDAZOLE (FLAGYL) IVPB 500 mg       See Hyperspace for full Linked Orders Report.   500 mg 100 mL/hr over 60 Minutes  Intravenous Every 8 hours 10/02/22 0946 10/09/22 0959   10/02/22 0815  vancomycin (VANCOCIN) IVPB 1000 mg/200 mL premix        1,000 mg 200 mL/hr over 60 Minutes Intravenous  Once 10/02/22 0802 10/02/22 1055       REVIEW OF SYSTEMS:  Const: negative fever, negative chills, negative weight loss Eyes: negative diplopia or visual changes, negative eye pain ENT: negative coryza, negative sore throat Resp: negative cough, hemoptysis, dyspnea Cards: negative for chest pain, palpitations, lower extremity edema GU: negative for frequency, dysuria and hematuria GI: Negative for abdominal pain, diarrhea, bleeding, constipation Skin: negative for rash and pruritus Heme: negative for easy bruising and gum/nose bleeding MS: foot pain Neurolo:negative for headaches, dizziness, vertigo, memory problems  Psych: negative for feelings of anxiety, depression  Endocrine: negative for thyroid, borderline diabetes Allergy/Immunology- negative for any medication or food allergies  Objective:  VITALS:  BP 127/82 (BP Location: Right Arm)   Pulse 74   Temp 98.2 F (36.8 C)   Resp 16   Ht _0  (1.905 m)   Wt (!) 163.7 kg   SpO2 97%   BMI 45.11 kg/m   PHYSICAL EXAM:  General: Alert, cooperative, no distress, appears stated age.  Head: Normocephalic, without obvious abnormality, atraumatic. Eyes: Conjunctivae clear, anicteric sclerae. Pupils are equal ENT Nares normal. No drainage or sinus tenderness. Lips, mucosa, and tongue normal. No Thrush Neck: Supple, symmetrical, no adenopathy, thyroid: non tender no carotid bruit and no JVD. Back: No CVA tenderness. Lungs: Clear to auscultation bilaterally. No Wheezing or Rhonchi. No rales. Heart: Regular rate and rhythm, no murmur, rub or gallop. Abdomen: Soft, non-tender,not distended. Bowel sounds normal. No masses Extremities: rt foot- some swelling- rt great toe amputated  Skin: No rashes or lesions. Or bruising Lymph: Cervical, supraclavicular  normal. Neurologic: Grossly non-focal Pertinent Labs Lab Results CBC    Component Value Date/Time   WBC 11.3 (H) 10/06/2022 0146   RBC 4.62 10/06/2022 0146   HGB 12.8 (L) 10/06/2022 0146   HGB 15.6 06/21/2014 1851   HCT 40.3 10/06/2022 0146   HCT 47.5 06/21/2014 1851   PLT 271 10/06/2022 0146   PLT 208 06/21/2014 1851   MCV 87.2 10/06/2022 0146   MCV 88 06/21/2014 1851   MCH 27.7 10/06/2022 0146   MCHC 31.8 10/06/2022 0146   RDW 15.3 10/06/2022 0146   RDW 14.3 06/21/2014 1851   LYMPHSABS 1.7 08/24/2022 1852   LYMPHSABS 1.2 06/21/2014 1851   MONOABS 1.6 (H) 08/24/2022 1852   MONOABS 0.4 06/21/2014 1851   EOSABS 0.0 08/24/2022 1852   EOSABS 0.1 06/21/2014 1851   BASOSABS 0.1 08/24/2022 1852   BASOSABS 0.1 06/21/2014 1851       Latest Ref Rng & Units 10/06/2022    1:46 AM 10/05/2022    5:43 AM 10/04/2022    4:15 AM  CMP  Glucose 70 - 99 mg/dL 128  123  122   BUN 6 - 20 mg/dL _1 Creatinine 0.61 - 1.24 mg/dL 0.84  0.65  0.76   Sodium 135 - 145 mmol/L 135  137  135   Potassium 3.5 - 5.1 mmol/L 3.9  4.1  3.7   Chloride 98 - 111 mmol/L 104  107  105   CO2 22 - 32 mmol/L _2 Calcium 8.9 - 10.3 mg/dL 9.2  9.0  8.7       Microbiology: Recent Results (from the past 240 hour(s))  Blood culture (single)     Status: None (Preliminary result)   Collection Time: 10/02/22  8:52 AM   Specimen: BLOOD  Result Value Ref Range Status   Specimen Description BLOOD BLOOD  LEFT HAND  Final   Special Requests   Final    BOTTLES DRAWN AEROBIC AND ANAEROBIC Blood Culture adequate volume   Culture   Final    NO GROWTH 3 DAYS Performed at [facility], [address] [place], [place] [address]    Report Status PENDING  Incomplete  Surgical PCR screen     Status: None   Collection Time: 10/03/22  6:21 AM   Specimen: Nasal Mucosa; Nasal Swab  Result Value Ref Range Status   MRSA, PCR NEGATIVE NEGATIVE Final   Staphylococcus aureus NEGATIVE NEGATIVE Final     Comment: (NOTE) The Xpert SA Assay (FDA approved for NASAL specimens in patients 38 years of age and older), is one component of a comprehensive surveillance program. It is not intended to diagnose infection nor to guide or monitor treatment. Performed at [facility], [address] [place], [place] [address]   Aerobic/Anaerobic Culture w Gram Stain (surgical/deep wound)     Status: None (Preliminary result)   Collection Time: 10/03/22  8:21 AM   Specimen: Abscess  Result Value Ref Range Status   Specimen Description   Final    ABSCESS Performed at [facility], [address], [place], [place] [address]    Special Requests RIGHT FOOT  Final   Gram Stain   Final    NO WBC SEEN NO ORGANISMS SEEN Performed at [facility], [place] [address] [place], [place] [address]    Culture   Final    CULTURE REINCUBATED FOR BETTER GROWTH NO ANAEROBES ISOLATED; CULTURE IN PROGRESS FOR 5 DAYS    Report Status PENDING  Incomplete  Aerobic/Anaerobic Culture w Gram Stain (surgical/deep wound)     Status: None (Preliminary result)   Collection Time: 10/03/22  8:25 AM   Specimen: PATH Bone biopsy; Tissue  Result Value Ref Range Status   Specimen Description BONE  Final   Special Requests RIGHT 1ST METATARSAL  Final   Gram Stain   Final    NO ORGANISMS SEEN NO WBC SEEN Performed at [facility], [address], [place], [place] [address]    Culture   Final    RARE GRAM POSITIVE RODS IDENTIFICATION TO FOLLOW CRITICAL RESULT CALLED TO, READ BACK BY AND VERIFIED WITH: RN ANNA.R AT 1433 ON 10/05/2022 BY T.SAAD. NO ANAEROBES ISOLATED; CULTURE IN PROGRESS FOR 5 DAYS    Report Status PENDING  Incomplete    IMAGING RESULTS:  I have personally reviewed the films  rt great toe amputated  Impression/Recommendation  Rt great toe infection s/p partial ray amputation on 11/1 followed by further debridement this admission Chronic osteo in the bone. Will discuss  wih Dr.Cline to determine if proximal margin of the bone was sent.to look for clearance Depending on that and bone cukute        ___________________________________________________ Discussed with patient, requesting provider Note:  This document was prepared using Dragon voice recognition software and may include unintentional dictation errors.

## 2022-10-06 NOTE — Plan of Care (Signed)

## 2022-10-07 ENCOUNTER — Inpatient Hospital Stay: Payer: Self-pay

## 2022-10-07 DIAGNOSIS — T8149XA Infection following a procedure, other surgical site, initial encounter: Secondary | ICD-10-CM

## 2022-10-07 LAB — CBC
HCT: 42.2 % (ref 39.0–52.0)
Hemoglobin: 13.4 g/dL (ref 13.0–17.0)
MCH: 27.3 pg (ref 26.0–34.0)
MCHC: 31.8 g/dL (ref 30.0–36.0)
MCV: 86.1 fL (ref 80.0–100.0)
Platelets: 305 10*3/uL (ref 150–400)
RBC: 4.9 MIL/uL (ref 4.22–5.81)
RDW: 15.6 % — ABNORMAL HIGH (ref 11.5–15.5)
WBC: 12.2 10*3/uL — ABNORMAL HIGH (ref 4.0–10.5)
nRBC: 0 % (ref 0.0–0.2)

## 2022-10-07 LAB — BASIC METABOLIC PANEL
Anion gap: 10 (ref 5–15)
BUN: 15 mg/dL (ref 6–20)
CO2: 24 mmol/L (ref 22–32)
Calcium: 9.1 mg/dL (ref 8.9–10.3)
Chloride: 101 mmol/L (ref 98–111)
Creatinine, Ser: 0.81 mg/dL (ref 0.61–1.24)
GFR, Estimated: >60 mL/min (ref >60)
Glucose, Bld: 124 mg/dL — ABNORMAL HIGH (ref 70–99)
Potassium: 3.8 mmol/L (ref 3.5–5.1)
Sodium: 135 mmol/L (ref 135–145)

## 2022-10-07 LAB — CULTURE, BLOOD (SINGLE)
Culture: NO GROWTH
Special Requests: ADEQUATE

## 2022-10-07 LAB — MAGNESIUM: Magnesium: 2.1 mg/dL (ref 1.7–2.4)

## 2022-10-07 MED ORDER — SODIUM CHLORIDE 0.9% FLUSH: 10.0000 mL | INTRAVENOUS | Status: DC | PRN

## 2022-10-07 MED ORDER — SODIUM CHLORIDE 0.9% FLUSH
10.0000 mL | Freq: Two times a day (BID) | INTRAVENOUS | Status: DC
  Administered 2022-10-07 – 2022-10-08 (×3): 10 mL

## 2022-10-07 MED ORDER — METRONIDAZOLE 500 MG PO TABS
500.0000 mg | ORAL_TABLET | Freq: Two times a day (BID) | ORAL | Status: DC
  Administered 2022-10-07 – 2022-10-08 (×3): 500 mg via ORAL
  Filled 2022-10-07 (×3): qty 1

## 2022-10-07 MED ORDER — SODIUM CHLORIDE 0.9 % IV SOLN
2.0000 g | INTRAVENOUS | Status: DC
  Administered 2022-10-08: 2 g via INTRAVENOUS
  Filled 2022-10-07: qty 2

## 2022-10-07 MED ORDER — CHLORHEXIDINE GLUCONATE CLOTH 2 % EX PADS
6.0000 | MEDICATED_PAD | Freq: Every day | CUTANEOUS | Status: DC
  Administered 2022-10-07 – 2022-10-08 (×2): 6 via TOPICAL

## 2022-10-07 NOTE — Consult Note (Signed)
Pharmacy Antibiotic Note  Jacob Love is a 41 y.o. male admitted on 10/02/2022 with osteomyelitis.  Pharmacy has been consulted for vancomycin dosing. S/p I&D of right foot with implantation of vancomycin beads.   Today, 10/07/2022 Day #6 antibiotics - Renal: SCr stable - WBC WNL - Foot cultures x 2 with C. Striatum - plan is for vancomycin and ceftriaxone as outpatient  Previous dosing/level Patient was started on Vancomycin 2000 mg IV loading dose, followed by 1500 mg IV q8h. 12/11 13:30 Vancomycin trough 19 mcg/ml  Plan: Continue Vancomycin 2000 mg IV q12h Check vancomycin trough this evening in preparation for discharge on IV antibiotics Goal trough 15-78mcg/mL as trough will be monitored as outpatient See OPAT orders/note  Height: 6\' 3"  (190.5 cm) Weight: (!) 163.7 kg (360 lb 14.3 oz) IBW/kg (Calculated) : 84.5  Temp (24hrs), Avg:98.2 F (36.8 C), Min:98.2 F (36.8 C), Max:98.3 F (36.8 C)  Recent Labs  Lab 10/03/22 0432 10/04/22 0415 10/05/22 0543 10/05/22 1317 10/06/22 0146 10/07/22 0209  WBC 8.6 8.5 9.5  --  11.3* 12.2*  CREATININE 0.83 0.76 0.65  --  0.84 0.81  VANCOTROUGH  --   --   --  19  --   --      Estimated Creatinine Clearance: 193.3 mL/min (by C-G formula based on SCr of 0.81 mg/dL).    No Known Allergies  Antimicrobials this admission: Vancomycin 12/8 >>  Ceftriaxone 12/8 >>  Flagyl 12/8 >>   Dose adjustments this admission: 12/11 Vancomycin changed from 1500 mg q8h to 2000 mg q12h  Microbiology results: 12/8 BCx: NGTD 12/9 abscess Cx: C. striatum  Thank you for allowing pharmacy to be a part of this patient's care.  14/9, PharmD, BCPS, BCIDP Work Cell: [phone] 10/07/2022 4:27 PM

## 2022-10-07 NOTE — Progress Notes (Signed)
4 Days Post-Op   Subjective/Chief Complaint: Patient seen.  No complaints.   Objective: Vital signs in last 24 hours: Temp:  [97.6 F (36.4 C)-98.2 F (36.8 C)] 98.2 F (36.8 C) (12/13 0803) Pulse Rate:  [71-79] 71 (12/13 0803) Resp:  [16-20] 16 (12/13 0803) BP: (114-139)/(75-92) 116/77 (12/13 0803) SpO2:  [95 %-98 %] 97 % (12/13 0803) Last BM Date : 10/05/22  Intake/Output from previous day: 12/12 0701 - 12/13 0700 In: 300 [IV Piggyback:300] Out: 900 [Urine:900] Intake/Output this shift: Total I/O In: 800 [IV Piggyback:800] Out: -   Still some moderate bleeding and drainage on the bandaging.  No purulence noted.  Mild maceration in the central aspect of the incision but well coapted.  Pathology returned with bone involvement at the proximal cut end.  Lab Results:  Recent Labs    10/06/22 0146 10/07/22 0209  WBC 11.3* 12.2*  HGB 12.8* 13.4  HCT 40.3 42.2  PLT 271 305   BMET Recent Labs    10/06/22 0146 10/07/22 0209  NA 135 135  K 3.9 3.8  CL 104 101  CO2 25 24  GLUCOSE 128* 124*  BUN 11 15  CREATININE 0.84 0.81  CALCIUM 9.2 9.1   PT/INR No results for input(s): "LABPROT", "INR" in the last 72 hours. ABG No results for input(s): "PHART", "HCO3" in the last 72 hours.  Invalid input(s): "PCO2", "PO2"  Studies/Results: [place] EKG SITE RITE  Result Date: 10/07/2022 If Site Rite image not attached, placement could not be confirmed due to current cardiac rhythm.   Anti-infectives: Anti-infectives (From admission, onward)    Start     Dose/Rate Route Frequency Ordered Stop   10/07/22 1000  cefTRIAXone (ROCEPHIN) 2 g in sodium chloride 0.9 % 100 mL IVPB       See Hyperspace for full Linked Orders Report.   2 g 200 mL/hr over 30 Minutes Intravenous Every 24 hours 10/06/22 2337 10/09/22 0959   10/07/22 0600  metroNIDAZOLE (FLAGYL) IVPB 500 mg       See Hyperspace for full Linked Orders Report.   500 mg 100 mL/hr over 60 Minutes Intravenous Every 8 hours  10/06/22 2337 10/09/22 1359   10/06/22 0000  vancomycin (VANCOREADY) IVPB 2000 mg/400 mL        2,000 mg 200 mL/hr over 120 Minutes Intravenous Every 12 hours 10/05/22 1429     10/04/22 0500  vancomycin (VANCOREADY) IVPB 1500 mg/300 mL  Status:  Discontinued        1,500 mg 150 mL/hr over 120 Minutes Intravenous Every 8 hours 10/03/22 2040 10/05/22 1429   10/03/22 2100  vancomycin (VANCOREADY) IVPB 2000 mg/400 mL        2,000 mg 200 mL/hr over 120 Minutes Intravenous  Once 10/03/22 2001 10/03/22 2312   10/03/22 0839  vancomycin (VANCOCIN) powder  Status:  Discontinued          As needed 10/03/22 0840 10/03/22 0859   10/02/22 1000  cefTRIAXone (ROCEPHIN) 2 g in sodium chloride 0.9 % 100 mL IVPB  Status:  Discontinued       See Hyperspace for full Linked Orders Report.   2 g 200 mL/hr over 30 Minutes Intravenous Every 24 hours 10/02/22 0946 10/06/22 2337   10/02/22 1000  metroNIDAZOLE (FLAGYL) IVPB 500 mg  Status:  Discontinued       See Hyperspace for full Linked Orders Report.   500 mg 100 mL/hr over 60 Minutes Intravenous Every 8 hours 10/02/22 0946 10/06/22 2337   10/02/22  0815  vancomycin (VANCOCIN) IVPB 1000 mg/200 mL premix        1,000 mg 200 mL/hr over 60 Minutes Intravenous  Once 10/02/22 0802 10/02/22 1055       Assessment/Plan: s/p Procedure(s): IRRIGATION AND DEBRIDEMENT SOFT TISSUE AND BONE RIGHT FOOT (Right) Assessment: Status postdebridement right foot with continued osteomyelitis.  Plan: Betadine gauze and sterile dressing reapplied to the right foot.  Discussed with the patient that at this point I would recommend that we do not perform any additional debridement but proceed with IV antibiotics for his osteomyelitis.  Discussed his case with infectious disease.  If the IV antibiotics is unsuccessful he will most likely need complete disarticulation of the remainder of his first metatarsal.  Patient will also be for significant risk for the need for transmetatarsal  amputation at some point in the future.  Patient instructed to continue nonweightbearing on the right foot.  Instructed on changing the bandage every couple of days.  Plan for follow-up outpatient next week.  LOS: 5 days    Ricci Barker 10/07/2022

## 2022-10-07 NOTE — Progress Notes (Signed)
PHARMACY CONSULT NOTE FOR:  OUTPATIENT  PARENTERAL ANTIBIOTIC THERAPY (OPAT)  Indication: Foot infection with chromic osteomyelitis Regimen: vancomycin 2gm IV q12h and ceftriaxone 2gm IV q24h End date: 11/01/2022  Labs - Every Monday:  CBC/D, CMP, and vancomycin trough. Please pull PIC at completion of IV antibiotics Fax weekly lab results  promptly to [phone]  IV antibiotic discharge orders are pended. To discharging provider:  please sign these orders via discharge navigator,  Select New Orders & click on the button choice - Manage This Unsigned Work.     Thank you for allowing pharmacy to be a part of this patient's care.  Juliette Alcide, PharmD, BCPS, BCIDP Work Cell: [phone] 10/07/2022 4:24 PM

## 2022-10-07 NOTE — Progress Notes (Signed)
Peripherally Inserted Central Catheter Placement  The IV Nurse has discussed with the patient and/or persons authorized to consent for the patient, the purpose of this procedure and the potential benefits and risks involved with this procedure.  The benefits include less needle sticks, lab draws from the catheter, and the patient may be discharged home with the catheter. Risks include, but not limited to, infection, bleeding, blood clot (thrombus formation), and puncture of an artery; nerve damage and irregular heartbeat and possibility to perform a PICC exchange if needed/ordered by physician.  Alternatives to this procedure were also discussed.  Bard Power PICC patient education guide, fact sheet on infection prevention and patient information card has been provided to patient /or left at bedside.    PICC Placement Documentation  PICC Single Lumen 10/07/22 Right Basilic 46 cm 0 cm (Active)  Indication for Insertion or Continuance of Line Home intravenous therapies (PICC only) 10/07/22 1031  Exposed Catheter (cm) 0 cm 10/07/22 1031  Site Assessment Clean, Dry, Intact 10/07/22 1031  Line Status Blood return noted;Flushed;Saline locked 10/07/22 1031  Dressing Type Transparent;Securing device 10/07/22 1031  Dressing Status Antimicrobial disc in place 10/07/22 1031  Safety Lock Not Applicable 10/07/22 1031  Dressing Change Due 10/14/22 10/07/22 1031       Romie Jumper 10/07/2022, 10:35 AM

## 2022-10-07 NOTE — Progress Notes (Signed)
CSW spoke with Jeri Modena to discuss options for pt for when he discharges.  Pam states that pt will need nursing care and he has already agreed to pay for his abx out of pocket.  Pt has no insurance and he stated that he has applied for Medicaid, but has been told that he makes too much money.  He currently works [employer] education [employer]. Pam was inquiring about an LOG for pt for nursing care, however, Minerva Areola (my mgr) wants Pam to discuss him paying privately since he has opted out of having insurance coverage through his job.  Pam will follow-up tomorrow after she sees patient and update TOC.  [address] [place], LCSW [phone]

## 2022-10-07 NOTE — Treatment Plan (Signed)
Diagnosis: Rt foot infection/chronic osteo Baseline Creatinine <1  Culture Result: corynebacterium striatum  No Known Allergies  OPAT Orders Discharge antibiotics: Per pharmacy protocol  Vancomycin 2 grams IV every 12 hours Aim for Vancomycin trough 15-20 (unless otherwise indicated) Ceftriaxone 2 grams IV every 24 hours Duration: 4 weeks End Date: 11/01/22   [facility] Care Per Protocol:  Labs weekly on Mondays while on IV antibiotics: _X_ CBC with differential  _X_ CMP  __X Vancomycin trough     _X_ Please pull PIC at completion of IV antibiotics _ Fax weekly lab results  promptly to [phone]  Clinic Follow Up Appt: with Dr.Jaishon Krisher 10/29/21 at 10/30 am   Call [phone] with any questions

## 2022-10-07 NOTE — Plan of Care (Signed)
  Problem: Clinical Measurements: Goal: Ability to maintain clinical measurements within normal limits will improve Outcome: Progressing   Problem: Clinical Measurements: Goal: Diagnostic test results will improve Outcome: Progressing   Problem: Clinical Measurements: Goal: Respiratory complications will improve Outcome: Progressing   Problem: Clinical Measurements: Goal: Cardiovascular complication will be avoided Outcome: Progressing   Problem: Skin Integrity: Goal: Risk for impaired skin integrity will decrease Outcome: Progressing   Problem: Safety: Goal: Ability to remain free from injury will improve Outcome: Progressing

## 2022-10-07 NOTE — Progress Notes (Signed)
[facility]  - [facility]   PATIENT NAME: Jacob Love    MR#:  [id number]  DATE OF BIRTH:  Nov 15, 1980  SUBJECTIVE:   Overall doing well. Anxious to go home. Wants to see his family. He understands me working on IV antibiotics that needs to be managed as outpatient. Patient got PICC line today.   VITALS:  Blood pressure 136/77, pulse 83, temperature 98.3 F (36.8 C), resp. rate 18, height 6\' 3"  (1.905 m), weight (!) 163.7 kg, SpO2 98 %.  PHYSICAL EXAMINATION:   GENERAL:  41 y.o.-year-old patient lying in the bed with no acute distress.  LUNGS: Normal breath sounds bilaterally, no wheezing CARDIOVASCULAR: S1, S2 normal. No murmur   ABDOMEN: Soft, nontender, nondistended. Bowel sounds present.  EXTREMITIES: No  edema b/l.   PICC in right UE NEUROLOGIC: nonfocal  patient is alert and awake SKIN: No obvious rash, lesion, or ulcer.   LABORATORY PANEL:  CBC Recent Labs  Lab 10/07/22 0209  WBC 12.2*  HGB 13.4  HCT 42.2  PLT 305    Chemistries  Recent Labs  Lab 10/02/22 0852 10/02/22 1059 10/07/22 0209  NA 137   < > 135  K 3.4*   < > 3.8  CL 104   < > 101  CO2 24   < > 24  GLUCOSE 134*   < > 124*  BUN 10   < > 15  CREATININE 0.75   < > 0.81  CALCIUM 9.3   < > 9.1  MG  --    < > 2.1  AST 32  --   --   ALT 25  --   --   ALKPHOS 119  --   --   BILITOT 0.8  --   --    < > = values in this interval not displayed.    Assessment and Plan D'Arcy Abraha Wilbourn is a 41 y.o. male with medical history significant for morbid obesity (BMI 45), hypertension, history of Mnire's disease, history of neuropathic foot ulcer, recent admission for severe sepsis from osteomyelitis status post right partial first ray amputation and right second metatarsal head bone biopsy on 08/26/22 and was discharged on antibiotic therapy to follow-up with podiatry as an outpatient.   Continues to have a chronic open wound at the surgical site and recently seen outpatient  with the wound probing down to the level of the bone with x-ray changes consistent with continued osteomyelitis. Decision was made for hospitalization and revision of his surgery.    Acute Osteomyelitis of right foot with Full-thickness ulceration with abscess S/p surgical I/D of right foot on 12/9 --path result out today, reported CHRONIC ACTIVE OSTEOMYELITIS. --cont vanc, ceftriaxone as out pt per ID, end date 11/01/22 --nonweightbearing on the right foot  --ID consult appreciated  Rt foot infection/chronic osteo Baseline Creatinine <1   Culture Result: corynebacterium striatum   No Known Allergies   OPAT Orders Discharge antibiotics: Per pharmacy protocol  Vancomycin 2 grams IV every 12 hours Aim for Vancomycin trough 15-20 (unless otherwise indicated) Ceftriaxone 2 grams IV every 24 hours Duration: 4 weeks End Date: 11/01/22     [facility] Care Per Protocol:   Labs weekly on Mondays while on IV antibiotics: _X_ CBC with differential   _X_ CMP   __X Vancomycin trough       _X_ Please pull PIC at completion of IV antibiotics _ Fax weekly lab results  promptly to [phone]   Clinic Follow  Up Appt: with Dr.Ravishankar 10/29/21 at 10/30 am      Essential hypertension Continue hydrochlorothiazide   Morbid obesity with BMI of 45.0-49.9, adult (HCC) BMI 45.1 Complicates overall prognosis and care Lifestyle modification and exercise has been discussed with patient in detail   Hypokalemia --monitor and replete PRN   Prediabetes --recent A1c 5.9     DVT prophylaxis: Lovenox SQ Code Status: Full code  Family Communication: none today Level of care: Med-Surg Dispo:   The patient is from: home Anticipated d/c is to: home Anticipated d/c date is: discharge likely tomorrow once home IV antibiotic has been situated      TOTAL TIME TAKING CARE OF THIS PATIENT: 35 minutes.  >50% time spent on counselling and coordination of care  Note: This dictation was prepared  with Dragon dictation along with smaller phrase technology. Any transcriptional errors that result from this process are unintentional.  Enedina Finner M.D    [facility]   CC: Primary care physician; [facility]

## 2022-10-07 NOTE — Progress Notes (Signed)
PHARMACIST - PHYSICIAN COMMUNICATION DR:   TRH CONCERNING: Antibiotic IV to Oral Route Change Policy  RECOMMENDATION: This patient is receiving metronidazole by the intravenous route.  Based on criteria approved by the Pharmacy and Therapeutics Committee, the antibiotic(s) is/are being converted to the equivalent oral dose form(s).   DESCRIPTION: These criteria include: Patient being treated for a respiratory tract infection, urinary tract infection, cellulitis or clostridium difficile associated diarrhea if on metronidazole The patient is not neutropenic and does not exhibit a GI malabsorption state The patient is eating (either orally or via tube) and/or has been taking other orally administered medications for a least 24 hours The patient is improving clinically and has a Tmax < 100.5  If you have questions about this conversion, please contact the Pharmacy Department  []   [phone] )  ( [phone] [[phone]]   [phone] )  [facility] []   [phone] )  [facility] []   [phone] )  [facility] []   [phone] )  [facility]    ( [phone], PharmD, [place], Work Cell: [phone] 10/07/2022 11:40 AM

## 2022-10-07 NOTE — Progress Notes (Signed)
   Date of Admission:  10/02/2022      ID: Jacob Love is a 41 y.o. male  Principal Problem:   Osteomyelitis of ankle or foot, acute, right (HCC) Active Problems:   Essential hypertension   Morbid obesity with BMI of 45.0-49.9, adult (HCC)   Hypokalemia    Subjective: Patient is stable No fever No pain He is keen to go home.  Medications:   (feeding supplement) PROSource Plus  30 mL Oral BID BM   Chlorhexidine Gluconate Cloth  6 each Topical Daily   enoxaparin (LOVENOX) injection  80 mg Subcutaneous Q24H   hydrochlorothiazide  12.5 mg Oral Daily   metroNIDAZOLE  500 mg Oral Q12H   multivitamin with minerals  1 tablet Oral Daily   nutrition supplement (JUVEN)  1 packet Oral BID BM   sodium chloride flush  10-40 mL Intracatheter Q12H   sodium chloride flush  3 mL Intravenous Q12H    Objective: Vital signs in last 24 hours: Temp:  [97.6 F (36.4 C)-98.2 F (36.8 C)] 98.2 F (36.8 C) (12/13 0803) Pulse Rate:  [71-79] 71 (12/13 0803) Resp:  [16-20] 16 (12/13 0803) BP: (114-139)/(75-92) 116/77 (12/13 0803) SpO2:  [95 %-98 %] 97 % (12/13 0803)  LDA Foley Central lines Other catheters  PHYSICAL EXAM:  General: Alert, cooperative, no distress, appears stated age.  Lungs: Clear to auscultation bilaterally. No Wheezing or Rhonchi. No rales. Heart: Regular rate and rhythm, no murmur, rub or gallop. Abdomen: Soft, non-tender,not distended. Bowel sounds normal. No masses Extremities: Right foot dressing not removed  Skin: No rashes or lesions. Or bruising Lymph: Cervical, supraclavicular normal. Neurologic: Grossly non-focal  Lab Results Recent Labs    10/06/22 0146 10/07/22 0209  WBC 11.3* 12.2*  HGB 12.8* 13.4  HCT 40.3 42.2  NA 135 135  K 3.9 3.8  CL 104 101  CO2 25 24  BUN 11 15  CREATININE 0.84 0.81     Microbiology: Wound culture.  Corynebacterium striatum Bone pathology.  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 Bone pathology is chronic osteomyelitis Patient is on ceftriaxone and vancomycin and will need to give it for at least 4 weeks Can discontinue Flagyl Discussed the management with the patient and Dr. Graciela Husbands Patient has no insurance and hence arrangement for antibiotics and home health nursing is taking time.  Patient is aware of it but slightly disappointed as he cannot leave today. OPAT orders placed. Will follow him as outpatient.

## 2022-10-07 NOTE — Progress Notes (Signed)
Unable to administer 0600 dose of Flagyl due to pt accidentally pulling out IV access while asleep.    Pt is a difficult IV stick; IV consult ordered.

## 2022-10-08 LAB — AEROBIC/ANAEROBIC CULTURE W GRAM STAIN (SURGICAL/DEEP WOUND): Gram Stain: NONE SEEN

## 2022-10-08 LAB — BASIC METABOLIC PANEL
Anion gap: 7 (ref 5–15)
BUN: 17 mg/dL (ref 6–20)
CO2: 25 mmol/L (ref 22–32)
Calcium: 9.1 mg/dL (ref 8.9–10.3)
Chloride: 103 mmol/L (ref 98–111)
Creatinine, Ser: 0.77 mg/dL (ref 0.61–1.24)
GFR, Estimated: >60 mL/min (ref >60)
Glucose, Bld: 121 mg/dL — ABNORMAL HIGH (ref 70–99)
Potassium: 4.1 mmol/L (ref 3.5–5.1)
Sodium: 135 mmol/L (ref 135–145)

## 2022-10-08 LAB — CBC
HCT: 41.9 % (ref 39.0–52.0)
Hemoglobin: 13.6 g/dL (ref 13.0–17.0)
MCH: 28 pg (ref 26.0–34.0)
MCHC: 32.5 g/dL (ref 30.0–36.0)
MCV: 86.4 fL (ref 80.0–100.0)
Platelets: 287 10*3/uL (ref 150–400)
RBC: 4.85 MIL/uL (ref 4.22–5.81)
RDW: 15.7 % — ABNORMAL HIGH (ref 11.5–15.5)
WBC: 11.7 10*3/uL — ABNORMAL HIGH (ref 4.0–10.5)
nRBC: 0 % (ref 0.0–0.2)

## 2022-10-08 LAB — MAGNESIUM: Magnesium: 2.1 mg/dL (ref 1.7–2.4)

## 2022-10-08 LAB — VANCOMYCIN, TROUGH: Vancomycin Tr: 14 ug/mL — ABNORMAL LOW (ref 15–20)

## 2022-10-08 MED ORDER — VANCOMYCIN IV (FOR PTA / DISCHARGE USE ONLY): 2000.0000 mg | Freq: Two times a day (BID) | INTRAVENOUS | 0 refills | Status: AC

## 2022-10-08 MED ORDER — CEFTRIAXONE IV (FOR PTA / DISCHARGE USE ONLY): 2.0000 g | INTRAVENOUS | 0 refills | Status: AC

## 2022-10-08 MED ORDER — ADULT MULTIVITAMIN W/MINERALS CH: 1.0000 | ORAL_TABLET | Freq: Every day | ORAL | 0 refills | Status: AC

## 2022-10-08 NOTE — TOC Progression Note (Signed)
Transition of Care [facility]) - Progression Note    Patient Details  Name: Jacob Love MRN: [id number] Date of Birth: [birth year]  Transition of Care [facility]) CM/SW Contact  Marlowe Sax, RN Phone Number: 10/08/2022, 9:30 AM  Clinical Narrative:    The patient will have [employer] for IC ABX, Teaching completed, will go to outpatient on Monday 8-9 AM to get lab draws and PICC line care No additional needs        Expected Discharge Plan and Services                                                 Social Determinants of Health (SDOH) Interventions    Readmission Risk Interventions     No data to display

## 2022-10-08 NOTE — Plan of Care (Signed)
  Problem: Education: Goal: Knowledge of General Education information will improve Description: Including pain rating scale, medication(s)/side effects and non-pharmacologic comfort measures 10/08/2022 0944 by Orma Render, RN Outcome: Adequate for Discharge 10/08/2022 0747 by Orma Render, RN Outcome: Progressing   Problem: Health Behavior/Discharge Planning: Goal: Ability to manage health-related needs will improve 10/08/2022 0944 by Orma Render, RN Outcome: Adequate for Discharge 10/08/2022 0747 by Orma Render, RN Outcome: Progressing   Problem: Clinical Measurements: Goal: Ability to maintain clinical measurements within normal limits will improve 10/08/2022 0944 by Orma Render, RN Outcome: Adequate for Discharge 10/08/2022 0747 by Orma Render, RN Outcome: Progressing Goal: Will remain free from infection 10/08/2022 0944 by Orma Render, RN Outcome: Adequate for Discharge 10/08/2022 0747 by Orma Render, RN Outcome: Progressing Goal: Diagnostic test results will improve 10/08/2022 0944 by Orma Render, RN Outcome: Adequate for Discharge 10/08/2022 0747 by Orma Render, RN Outcome: Progressing Goal: Respiratory complications will improve 10/08/2022 0944 by Orma Render, RN Outcome: Adequate for Discharge 10/08/2022 0747 by Orma Render, RN Outcome: Progressing Goal: Cardiovascular complication will be avoided 10/08/2022 0944 by Orma Render, RN Outcome: Adequate for Discharge 10/08/2022 0747 by Orma Render, RN Outcome: Progressing   Problem: Activity: Goal: Risk for activity intolerance will decrease 10/08/2022 0944 by Orma Render, RN Outcome: Adequate for Discharge 10/08/2022 0747 by Orma Render, RN Outcome: Progressing   Problem: Nutrition: Goal: Adequate nutrition will be maintained 10/08/2022 0944 by Orma Render, RN Outcome: Adequate for Discharge 10/08/2022 0747 by Orma Render, RN Outcome: Progressing   Problem:  Coping: Goal: Level of anxiety will decrease 10/08/2022 0944 by Orma Render, RN Outcome: Adequate for Discharge 10/08/2022 0747 by Orma Render, RN Outcome: Progressing   Problem: Elimination: Goal: Will not experience complications related to bowel motility 10/08/2022 0944 by Orma Render, RN Outcome: Adequate for Discharge 10/08/2022 0747 by Orma Render, RN Outcome: Progressing Goal: Will not experience complications related to urinary retention 10/08/2022 0944 by Orma Render, RN Outcome: Adequate for Discharge 10/08/2022 0747 by Orma Render, RN Outcome: Progressing   Problem: Pain Managment: Goal: General experience of comfort will improve 10/08/2022 0944 by Orma Render, RN Outcome: Adequate for Discharge 10/08/2022 0747 by Orma Render, RN Outcome: Progressing   Problem: Safety: Goal: Ability to remain free from injury will improve 10/08/2022 0944 by Orma Render, RN Outcome: Adequate for Discharge 10/08/2022 0747 by Orma Render, RN Outcome: Progressing   Problem: Skin Integrity: Goal: Risk for impaired skin integrity will decrease 10/08/2022 0944 by Orma Render, RN Outcome: Adequate for Discharge 10/08/2022 0747 by Orma Render, RN Outcome: Progressing

## 2022-10-08 NOTE — Plan of Care (Signed)

## 2022-10-08 NOTE — Discharge Summary (Signed)
Physician Discharge Summary   Patient: Jacob Love MRN: [id number] DOB: 09/05/1981  Admit date:     10/02/2022  Discharge date: 10/08/22  Discharge Physician: Enedina FinnerSona Saverio Kader   [place] [facility] [place]   Recommendations at discharge:   follow-up Dr. Linus Galasodd Cline in one week follow-up Dr. Joylene Draftavisankar on jan 4th F/u PCP in 1-2 weeks  Discharge Diagnoses: Principal Problem:   Osteomyelitis of ankle or foot, acute, right [facility](HCC) Active Problems:   Essential hypertension   Morbid obesity with BMI of 45.0-49.9, adult (HCC)   Hypokalemia   Wound infection after surgery   Hospital Course: Jacob Love is a 41 y.o. male with medical history significant for morbid obesity (BMI 45), hypertension, history of Mnire's disease, history of neuropathic foot ulcer, recent admission for severe sepsis from osteomyelitis status post right partial first ray amputation and right second metatarsal head bone biopsy on 08/26/22 and was discharged on antibiotic therapy to follow-up with podiatry as an outpatient.   Continues to have a chronic open wound at the surgical site and recently seen outpatient with the wound probing down to the level of the bone with x-ray changes consistent with continued osteomyelitis. Decision was made for hospitalization and revision of his surgery.    Acute Osteomyelitis of right foot with Full-thickness ulceration with abscess S/p surgical I/D of right foot on 12/9 --path  reported CHRONIC ACTIVE OSTEOMYELITIS. --cont vanc, ceftriaxone as out pt per ID, end date 11/01/22 --nonweightbearing on the right foot  --ID consult appreciated   Rt foot infection/chronic osteo Baseline Creatinine <1   Culture Result: corynebacterium striatum   No Known Allergies   OPAT Orders Discharge antibiotics: Per pharmacy protocol  Vancomycin 2 grams IV every 12 hours Aim for Vancomycin trough 15-20 (unless otherwise indicated) Ceftriaxone 2 grams IV every 24 hours Duration: 4  weeks End Date: 11/01/22     [facility]:   Labs weekly on Mondays while on IV antibiotics: _X_ CBC with differential   _X_ CMP   __X Vancomycin trough   _X_ Please pull PIC at completion of IV antibiotics _ Fax weekly lab results  promptly to [phone]   Clinic Follow Up Appt: with Dr.Ravishankar 10/29/21 at 10/30 am      Essential hypertension Continue hydrochlorothiazide   Morbid obesity with BMI of 45.0-49.9, adult (HCC) BMI 45.1 Complicates overall prognosis and care Lifestyle modification and exercise has been discussed with patient in detail   Hypokalemia --monitor and replete PRN   Prediabetes --recent A1c 5.9     DVT prophylaxis: Lovenox SQ Code Status: Full code  Family Communication: none today Level of care: Med-Surg Dispo:   The patient is from: home Anticipated d/c is to: home Anticipated d/c date is: discharge today         Diet recommendation:  Cardiac and Carb modified diet DISCHARGE MEDICATION: Allergies as of 10/08/2022   No Known Allergies      Medication List     STOP taking these medications    ciprofloxacin 500 MG tablet Commonly known as: CIPRO   sulfamethoxazole-trimethoprim 800-160 MG tablet Commonly known as: BACTRIM DS       TAKE these medications    aspirin EC 81 MG tablet Take 81 mg by mouth daily.   cefTRIAXone  IVPB Commonly known as: ROCEPHIN Inject 2 g into the vein daily for 23 days. Indication: Foot infection with chronic osteomyelitis First Dose: Yes Last Day of Therapy:  11/01/2022 Labs - Every Monday:  CBC/D, CMP, and vancomycin trough. Please pull PIC at completion of IV antibiotics Fax weekly lab results  promptly to [phone] Method of administration: IV Push Method of administration may be changed at the discretion of home infusion pharmacist based upon assessment of the patient and/or caregiver's ability to self-administer the medication ordered. Start taking on: October 09, 2022   hydrochlorothiazide 25 MG tablet Commonly known as: HYDRODIURIL Take 25 mg by mouth daily.   HYDROcodone-acetaminophen 5-325 MG tablet Commonly known as: NORCO/VICODIN Take 1-2 tablets by mouth every 6 (six) hours as needed for severe pain.   ketoconazole 2 % cream Commonly known as: NIZORAL Apply topically.   multivitamin with minerals Tabs tablet Take 1 tablet by mouth daily. Start taking on: October 09, 2022   nicotine 7 mg/24hr patch Commonly known as: NICODERM CQ - dosed in mg/24 hr Place 7 mg onto the skin daily.   vancomycin  IVPB Inject 2,000 mg into the vein every 12 (twelve) hours for 24 days. Indication:  foot infection with chronic osteomyelitis First Dose: Yes Last Day of Therapy:  11/01/2022 Labs - Every Monday:  CBC/D, CMP, and vancomycin trough. Please pull PIC at completion of IV antibiotics Fax weekly lab results  promptly to [phone] Method of administration:Elastomeric Method of administration may be changed at the discretion of the patient and/or caregiver's ability to self-administer the medication ordered.               Discharge Care Instructions  (From admission, onward)           Start     Ordered   10/08/22 0000  Change dressing on IV access line weekly and PRN  (Home infusion instructions - [facility] )        10/08/22 0953            Follow-up Information     [place]. Schedule an appointment as soon as possible for a visit in 1 week(s).   Why: [place] f/u Contact information: [address] [place] [address] [phone]         Linus Galas, DPM. Schedule an appointment as soon as possible for a visit in 1 week(s).   Specialty: Podiatry Why: s/p irrigation and debridement right foot f/u Contact information: [address] [place] [address] [phone]         Lynn Ito, MD. Go on 10/29/2022.   Specialty: Infectious Diseases Why: f/u IV  abx therapy Contact information: [address] [place] [address] [phone]                Discharge Exam: Ceasar Mons Weights   10/02/22 0744  Weight: (!) 163.7 kg      Condition at discharge: fair  The results of significant diagnostics from this hospitalization (including imaging, microbiology, ancillary and laboratory) are listed below for reference.   Imaging Studies: [place] EKG SITE RITE  Result Date: 10/07/2022 If Site Rite image not attached, placement could not be confirmed due to current cardiac rhythm.  DG Foot Complete Right  Result Date: 10/04/2022 CLINICAL DATA:  Status post incision and debridement of right with placement of antibiotic impregnated beads. EXAM: RIGHT FOOT COMPLETE - 3 VIEW COMPARISON:  Right foot radiographs dated 08/26/2022 FINDINGS: Postsurgical changes from incision and debridement of right foot with further proximal amputation of the first metatarsal. Rounded radiodensities correspond to antibiotic impregnated beads. IMPRESSION: Postsurgical changes from incision and debridement of right foot with further proximal  amputation of the first metatarsal. Rounded radiodensities correspond to antibiotic impregnated beads. Electronically Signed   By: Agustin Cree M.D.   On: 10/04/2022 15:15    Microbiology: Results for orders placed or performed during the hospital encounter of 10/02/22  Blood culture (single)     Status: None   Collection Time: 10/02/22  8:52 AM   Specimen: BLOOD  Result Value Ref Range Status   Specimen Description BLOOD BLOOD LEFT HAND  Final   Special Requests   Final    BOTTLES DRAWN AEROBIC AND ANAEROBIC Blood Culture adequate volume   Culture   Final    NO GROWTH 5 DAYS Performed at [facility], [address], [place], [place] [address]    Report Status 10/07/2022 FINAL  Final  Surgical PCR screen     Status: None   Collection Time: 10/03/22  6:21 AM   Specimen: Nasal Mucosa; Nasal Swab  Result Value  Ref Range Status   MRSA, PCR NEGATIVE NEGATIVE Final   Staphylococcus aureus NEGATIVE NEGATIVE Final    Comment: (NOTE) The Xpert SA Assay (FDA approved for NASAL specimens in patients 89 years of age and older), is one component of a comprehensive surveillance program. It is not intended to diagnose infection nor to guide or monitor treatment. Performed at [facility], [address] [place], [place] [address]   Aerobic/Anaerobic Culture w Gram Stain (surgical/deep wound)     Status: None (Preliminary result)   Collection Time: 10/03/22  8:21 AM   Specimen: Abscess  Result Value Ref Range Status   Specimen Description   Final    ABSCESS Performed at [facility], [address], [place], [place] [address]    Special Requests RIGHT FOOT  Final   Gram Stain   Final    NO WBC SEEN NO ORGANISMS SEEN Performed at [facility], [address], [place], [place] [address]    Culture   Final    RARE CORYNEBACTERIUM STRIATUM Standardized susceptibility testing for this organism is not available. NO ANAEROBES ISOLATED; CULTURE IN PROGRESS FOR 5 DAYS    Report Status PENDING  Incomplete  Aerobic/Anaerobic Culture w Gram Stain (surgical/deep wound)     Status: None (Preliminary result)   Collection Time: 10/03/22  8:25 AM   Specimen: PATH Bone biopsy; Tissue  Result Value Ref Range Status   Specimen Description BONE  Final   Special Requests RIGHT 1ST METATARSAL  Final   Gram Stain NO ORGANISMS SEEN NO WBC SEEN   Final   Culture   Final    RARE CORYNEBACTERIUM STRIATUM Standardized susceptibility testing for this organism is not available. CRITICAL RESULT CALLED TO, READ BACK BY AND VERIFIED WITH: RN ANNA.R AT 1433 ON 10/05/2022 BY T.SAAD. NO ANAEROBES ISOLATED; CULTURE IN PROGRESS FOR 5 DAYS CULTURE REINCUBATED FOR BETTER GROWTH Performed at [facility], [address], [place], [place] [address]    Report Status PENDING  Incomplete     Labs: CBC: Recent Labs  Lab 10/04/22 0415 10/05/22 0543 10/06/22 0146 10/07/22 0209 10/08/22 0610  WBC 8.5 9.5 11.3* 12.2* 11.7*  HGB 12.8* 13.2 12.8* 13.4 13.6  HCT 40.2 42.0 40.3 42.2 41.9  MCV 88.2 86.8 87.2 86.1 86.4  PLT 239 256 271 305 287   Basic Metabolic Panel: Recent Labs  Lab 10/04/22 0415 10/05/22 0543 10/06/22 0146 10/07/22 0209 10/08/22 0610  NA 135 137 135 135 135  K 3.7 4.1 3.9 3.8 4.1  CL 105  107 104 101 103  CO2 24 25 25 24 25   GLUCOSE 122* 123* 128* 124* 121*  BUN 11 11 11 15 17   CREATININE 0.76 0.65 0.84 0.81 0.77  CALCIUM 8.7* 9.0 9.2 9.1 9.1  MG 2.1 2.1 2.2 2.1 2.1   Liver Function Tests: Recent Labs  Lab 10/02/22 0852  AST 32  ALT 25  ALKPHOS 119  BILITOT 0.8  PROT 7.8  ALBUMIN 3.8   CBG: No results for input(s): "GLUCAP" in the last 168 hours.  Discharge time spent: greater than 30 minutes.  Signed: , MD [facility] 10/08/2022

## 2022-10-08 NOTE — Consult Note (Signed)
Pharmacy Antibiotic Note  Jacob Love is a 41 y.o. male admitted on 10/02/2022 with osteomyelitis.  Pharmacy has been consulted for vancomycin dosing. S/p I&D of right foot with implantation of vancomycin beads.   Today, 10/08/2022 Day #7 antibiotics - Renal: SCr stable - WBC 11.7 - Foot cultures x 2 with C. Striatum - plan is for vancomycin and ceftriaxone as outpatient  Vancomycin trough 12/13 at 23:45 = 14 mcg/mL on 2gm IV q12h (previous dose given at 12:51p  Previous dosing/level Patient was started on Vancomycin 2000 mg IV loading dose, followed by 1500 mg IV q8h. 12/11 13:30 Vancomycin trough 19 mcg/ml  Plan: Continue Vancomycin 2000 mg IV q12h as trough level last evening was acceptable No changes in OPAT orders required Goal trough ~15-47mcg/mL as trough will be monitored as outpatient   Height: 6\' 3"  (190.5 cm) Weight: (!) 163.7 kg (360 lb 14.3 oz) IBW/kg (Calculated) : 84.5  Temp (24hrs), Avg:98.4 F (36.9 C), Min:98.3 F (36.8 C), Max:98.4 F (36.9 C)  Recent Labs  Lab 10/04/22 0415 10/05/22 0543 10/05/22 1317 10/06/22 0146 10/07/22 0209 10/07/22 2345 10/08/22 0610  WBC 8.5 9.5  --  11.3* 12.2*  --  11.7*  CREATININE 0.76 0.65  --  0.84 0.81  --  0.77  VANCOTROUGH  --   --  19  --   --  14*  --      Estimated Creatinine Clearance: 195.7 mL/min (by C-G formula based on SCr of 0.77 mg/dL).    No Known Allergies  Antimicrobials this admission: Vancomycin 12/8 >>  Ceftriaxone 12/8 >>  Flagyl 12/8 >>   Dose adjustments this admission: 12/11 Vancomycin changed from 1500 mg q8h to 2000 mg q12h  Microbiology results: 12/8 BCx: NGTD 12/9 abscess Cx: C. striatum  Thank you for allowing pharmacy to be a part of this patient's care.  14/9, PharmD, BCPS, BCIDP Work Cell: [phone] 10/08/2022 8:06 AM

## 2022-10-08 NOTE — Progress Notes (Signed)
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

## 2022-10-08 NOTE — Progress Notes (Signed)
PHARMACY - ANTIMICROBIAL STEWARDSHIP  Patient discharging on vancomycin and ceftriaxone IV.  He does not have insurance.    Spoke with [facility] and they are able to collect labs and do PICC care at much lower cost than private pay rate for [facility] to come to his house.    Patient to have labs drawn at Same Day on following dates 12/19 at 8a 12/26 at 8a 10/27/22 at 55 - to have PICC removed 11/02/22  Texted patient date and times of appointments  As way of monitoring for potential vancomycin-induced kidney injury,  instructed patient to stay hydrated by drinking water and if notices decrease urine output to contact physician.    Juliette Alcide, PharmD, BCPS, BCIDP Work Cell: [phone] 10/08/2022 12:22 PM

## 2022-10-13 ENCOUNTER — Ambulatory Visit
Admission: RE | Admit: 2022-10-13 | Discharge: 2022-10-13 | Disposition: A | Discharge status: Home / Self Care | Payer: Medicaid Other | Source: Ambulatory Visit | Attending: Infectious Diseases | Admitting: Infectious Diseases

## 2022-10-13 VITALS — BP 150/90 | HR 80 | Temp 96.7°F | Resp 18 | Ht 75.0 in | Wt 360.9 lb

## 2022-10-13 DIAGNOSIS — M86171 Other acute osteomyelitis, right ankle and foot: Secondary | ICD-10-CM | POA: Diagnosis not present

## 2022-10-13 LAB — CBC WITH DIFFERENTIAL/PLATELET
Abs Immature Granulocytes: 0.05 10*3/uL (ref 0.00–0.07)
Basophils Absolute: 0.1 10*3/uL (ref 0.0–0.1)
Basophils Relative: 1 %
Eosinophils Absolute: 0.6 10*3/uL — ABNORMAL HIGH (ref 0.0–0.5)
Eosinophils Relative: 5 %
HCT: 43.5 % (ref 39.0–52.0)
Hemoglobin: 13.8 g/dL (ref 13.0–17.0)
Immature Granulocytes: 0 %
Lymphocytes Relative: 17 %
Lymphs Abs: 2 10*3/uL (ref 0.7–4.0)
MCH: 27.3 pg (ref 26.0–34.0)
MCHC: 31.7 g/dL (ref 30.0–36.0)
MCV: 86.1 fL (ref 80.0–100.0)
Monocytes Absolute: 0.5 10*3/uL (ref 0.1–1.0)
Monocytes Relative: 4 %
Neutro Abs: 8.7 10*3/uL — ABNORMAL HIGH (ref 1.7–7.7)
Neutrophils Relative %: 73 %
Platelets: 305 10*3/uL (ref 150–400)
RBC: 5.05 MIL/uL (ref 4.22–5.81)
RDW: 15.4 % (ref 11.5–15.5)
WBC: 11.9 10*3/uL — ABNORMAL HIGH (ref 4.0–10.5)
nRBC: 0 % (ref 0.0–0.2)

## 2022-10-13 LAB — COMPREHENSIVE METABOLIC PANEL
ALT: 21 U/L (ref 0–44)
AST: 22 U/L (ref 15–41)
Albumin: 3.6 g/dL (ref 3.5–5.0)
Alkaline Phosphatase: 104 U/L (ref 38–126)
Anion gap: 9 (ref 5–15)
BUN: 8 mg/dL (ref 6–20)
CO2: 25 mmol/L (ref 22–32)
Calcium: 9 mg/dL (ref 8.9–10.3)
Chloride: 103 mmol/L (ref 98–111)
Creatinine, Ser: 0.75 mg/dL (ref 0.61–1.24)
GFR, Estimated: >60 mL/min (ref >60)
Glucose, Bld: 149 mg/dL — ABNORMAL HIGH (ref 70–99)
Potassium: 3.1 mmol/L — ABNORMAL LOW (ref 3.5–5.1)
Sodium: 137 mmol/L (ref 135–145)
Total Bilirubin: 1 mg/dL (ref 0.3–1.2)
Total Protein: 7.4 g/dL (ref 6.5–8.1)

## 2022-10-13 LAB — SUSCEPTIBILITY, GRAM POS RODS

## 2022-10-13 LAB — VANCOMYCIN, TROUGH: Vancomycin Tr: 15 ug/mL (ref 15–20)

## 2022-10-13 MED ORDER — HEPARIN SOD (PORK) LOCK FLUSH 100 UNIT/ML IV SOLN: 250.0000 [IU] | INTRAVENOUS | Status: AC | PRN

## 2022-10-13 MED ORDER — HEPARIN SOD (PORK) LOCK FLUSH 100 UNIT/ML IV SOLN
INTRAVENOUS | Status: AC
  Administered 2022-10-13: 250 [IU]
  Filled 2022-10-13: qty 5

## 2022-10-13 MED ORDER — SODIUM CHLORIDE 0.9% FLUSH
10.0000 mL | INTRAVENOUS | Status: DC | PRN
  Administered 2022-10-13: 10 mL

## 2022-10-13 NOTE — Progress Notes (Signed)
Patient came in for labs and PICC line care.  Labs were drawn from the PICC line without difficulty.  PICC line dressing was changed without issues.  Patient will return next week.

## 2022-10-15 ENCOUNTER — Ambulatory Visit: Payer: MEDICAID | Admitting: Infectious Diseases

## 2022-10-15 DIAGNOSIS — G629 Polyneuropathy, unspecified: Secondary | ICD-10-CM | POA: Diagnosis not present

## 2022-10-15 DIAGNOSIS — Z89411 Acquired absence of right great toe: Secondary | ICD-10-CM | POA: Diagnosis not present

## 2022-10-15 DIAGNOSIS — L97512 Non-pressure chronic ulcer of other part of right foot with fat layer exposed: Secondary | ICD-10-CM | POA: Diagnosis not present

## 2022-10-16 LAB — AEROBIC/ANAEROBIC CULTURE W GRAM STAIN (SURGICAL/DEEP WOUND): Gram Stain: NONE SEEN

## 2022-10-20 ENCOUNTER — Ambulatory Visit
Admission: RE | Admit: 2022-10-20 | Discharge: 2022-10-20 | Disposition: A | Discharge status: Home / Self Care | Payer: Medicaid Other | Source: Ambulatory Visit | Attending: Infectious Diseases | Admitting: Infectious Diseases

## 2022-10-20 VITALS — BP 130/74 | HR 84 | Temp 98.3°F | Resp 18

## 2022-10-20 DIAGNOSIS — Z01812 Encounter for preprocedural laboratory examination: Secondary | ICD-10-CM | POA: Diagnosis not present

## 2022-10-20 DIAGNOSIS — M86171 Other acute osteomyelitis, right ankle and foot: Secondary | ICD-10-CM | POA: Diagnosis not present

## 2022-10-20 LAB — CBC WITH DIFFERENTIAL/PLATELET
Abs Immature Granulocytes: 0.05 10*3/uL (ref 0.00–0.07)
Basophils Absolute: 0.1 10*3/uL (ref 0.0–0.1)
Basophils Relative: 1 %
Eosinophils Absolute: 0.7 10*3/uL — ABNORMAL HIGH (ref 0.0–0.5)
Eosinophils Relative: 7 %
HCT: 42.1 % (ref 39.0–52.0)
Hemoglobin: 13.4 g/dL (ref 13.0–17.0)
Immature Granulocytes: 1 %
Lymphocytes Relative: 19 %
Lymphs Abs: 1.8 10*3/uL (ref 0.7–4.0)
MCH: 27.7 pg (ref 26.0–34.0)
MCHC: 31.8 g/dL (ref 30.0–36.0)
MCV: 87 fL (ref 80.0–100.0)
Monocytes Absolute: 0.5 10*3/uL (ref 0.1–1.0)
Monocytes Relative: 5 %
Neutro Abs: 6.4 10*3/uL (ref 1.7–7.7)
Neutrophils Relative %: 67 %
Platelets: 262 10*3/uL (ref 150–400)
RBC: 4.84 MIL/uL (ref 4.22–5.81)
RDW: 15.3 % (ref 11.5–15.5)
WBC: 9.4 10*3/uL (ref 4.0–10.5)
nRBC: 0 % (ref 0.0–0.2)

## 2022-10-20 LAB — COMPREHENSIVE METABOLIC PANEL
ALT: 19 U/L (ref 0–44)
AST: 22 U/L (ref 15–41)
Albumin: 3.7 g/dL (ref 3.5–5.0)
Alkaline Phosphatase: 90 U/L (ref 38–126)
Anion gap: 8 (ref 5–15)
BUN: 10 mg/dL (ref 6–20)
CO2: 25 mmol/L (ref 22–32)
Calcium: 9 mg/dL (ref 8.9–10.3)
Chloride: 105 mmol/L (ref 98–111)
Creatinine, Ser: 0.71 mg/dL (ref 0.61–1.24)
GFR, Estimated: >60 mL/min (ref >60)
Glucose, Bld: 133 mg/dL — ABNORMAL HIGH (ref 70–99)
Potassium: 3.6 mmol/L (ref 3.5–5.1)
Sodium: 138 mmol/L (ref 135–145)
Total Bilirubin: 0.7 mg/dL (ref 0.3–1.2)
Total Protein: 7.2 g/dL (ref 6.5–8.1)

## 2022-10-20 LAB — VANCOMYCIN, TROUGH: Vancomycin Tr: 15 ug/mL (ref 15–20)

## 2022-10-20 NOTE — Progress Notes (Signed)
Patient labs and PICC dressing changed completed without issues. Patient is scheduled to return next week for labs only and following week for PICC line removal.

## 2022-10-27 ENCOUNTER — Ambulatory Visit
Admission: RE | Admit: 2022-10-27 | Discharge: 2022-10-27 | Disposition: A | Discharge status: Home / Self Care | Payer: Medicaid Other | Source: Ambulatory Visit | Attending: Infectious Diseases | Admitting: Infectious Diseases

## 2022-10-27 VITALS — BP 149/73 | HR 90 | Temp 97.8°F | Resp 20

## 2022-10-27 DIAGNOSIS — M86171 Other acute osteomyelitis, right ankle and foot: Secondary | ICD-10-CM | POA: Insufficient documentation

## 2022-10-27 LAB — CBC WITH DIFFERENTIAL/PLATELET
Abs Immature Granulocytes: 0.05 10*3/uL (ref 0.00–0.07)
Basophils Absolute: 0.1 10*3/uL (ref 0.0–0.1)
Basophils Relative: 1 %
Eosinophils Absolute: 0.6 10*3/uL — ABNORMAL HIGH (ref 0.0–0.5)
Eosinophils Relative: 7 %
HCT: 44.4 % (ref 39.0–52.0)
Hemoglobin: 14.2 g/dL (ref 13.0–17.0)
Immature Granulocytes: 1 %
Lymphocytes Relative: 16 %
Lymphs Abs: 1.5 10*3/uL (ref 0.7–4.0)
MCH: 27.6 pg (ref 26.0–34.0)
MCHC: 32 g/dL (ref 30.0–36.0)
MCV: 86.2 fL (ref 80.0–100.0)
Monocytes Absolute: 0.6 10*3/uL (ref 0.1–1.0)
Monocytes Relative: 6 %
Neutro Abs: 6.5 10*3/uL (ref 1.7–7.7)
Neutrophils Relative %: 69 %
Platelets: 258 10*3/uL (ref 150–400)
RBC: 5.15 MIL/uL (ref 4.22–5.81)
RDW: 15.4 % (ref 11.5–15.5)
WBC: 9.3 10*3/uL (ref 4.0–10.5)
nRBC: 0 % (ref 0.0–0.2)

## 2022-10-27 LAB — COMPREHENSIVE METABOLIC PANEL
ALT: 16 U/L (ref 0–44)
AST: 18 U/L (ref 15–41)
Albumin: 3.6 g/dL (ref 3.5–5.0)
Alkaline Phosphatase: 122 U/L (ref 38–126)
Anion gap: 10 (ref 5–15)
BUN: 9 mg/dL (ref 6–20)
CO2: 23 mmol/L (ref 22–32)
Calcium: 9 mg/dL (ref 8.9–10.3)
Chloride: 102 mmol/L (ref 98–111)
Creatinine, Ser: 0.75 mg/dL (ref 0.61–1.24)
GFR, Estimated: >60 mL/min (ref >60)
Glucose, Bld: 144 mg/dL — ABNORMAL HIGH (ref 70–99)
Potassium: 3.4 mmol/L — ABNORMAL LOW (ref 3.5–5.1)
Sodium: 135 mmol/L (ref 135–145)
Total Bilirubin: 0.9 mg/dL (ref 0.3–1.2)
Total Protein: 7.3 g/dL (ref 6.5–8.1)

## 2022-10-27 LAB — VANCOMYCIN, TROUGH: Vancomycin Tr: 15 ug/mL (ref 15–20)

## 2022-10-27 MED ORDER — SODIUM CHLORIDE 0.9% FLUSH
10.0000 mL | INTRAVENOUS | Status: DC | PRN
  Administered 2022-10-27: 20 mL

## 2022-10-27 MED ORDER — HEPARIN SOD (PORK) LOCK FLUSH 100 UNIT/ML IV SOLN: 250.0000 [IU] | INTRAVENOUS | Status: AC | PRN

## 2022-10-27 MED ORDER — HEPARIN SOD (PORK) LOCK FLUSH 100 UNIT/ML IV SOLN
INTRAVENOUS | Status: AC
  Administered 2022-10-27: 250 [IU]
  Filled 2022-10-27: qty 5

## 2022-10-27 NOTE — Progress Notes (Signed)
PICC line dressing changed without issues.  I was unable to draw labs from the line, it flushed easy but did not have any blood return.  Lab was called to draw the labs from a peripheral line.  No other concerns.  Plan for removal next Monday.

## 2022-10-29 ENCOUNTER — Ambulatory Visit: Payer: Medicaid Other | Attending: Infectious Diseases | Admitting: Infectious Diseases

## 2022-10-29 ENCOUNTER — Encounter: Payer: Self-pay | Admitting: Infectious Diseases

## 2022-10-29 ENCOUNTER — Other Ambulatory Visit: Payer: Self-pay

## 2022-10-29 VITALS — BP 148/88 | HR 86 | Temp 96.3°F

## 2022-10-29 DIAGNOSIS — M86671 Other chronic osteomyelitis, right ankle and foot: Secondary | ICD-10-CM | POA: Diagnosis not present

## 2022-10-29 DIAGNOSIS — M009 Pyogenic arthritis, unspecified: Secondary | ICD-10-CM | POA: Insufficient documentation

## 2022-10-29 DIAGNOSIS — I10 Essential (primary) hypertension: Secondary | ICD-10-CM | POA: Insufficient documentation

## 2022-10-29 MED ORDER — AMOXICILLIN-POT CLAVULANATE 875-125 MG PO TABS
1.0000 | ORAL_TABLET | Freq: Two times a day (BID) | ORAL | 0 refills | Status: AC
  Filled 2022-10-29: qty 28, 14d supply, fill #0

## 2022-10-29 MED ORDER — LINEZOLID 600 MG PO TABS
600.0000 mg | ORAL_TABLET | Freq: Two times a day (BID) | ORAL | 0 refills | Status: AC
  Filled 2022-10-29: qty 28, 14d supply, fill #0

## 2022-10-29 NOTE — Patient Instructions (Signed)
You are here for the rt foot infeciton- you will complete IV on 11/01/22 and start linezolid and augmentin twice a day both for 2 weeks

## 2022-10-29 NOTE — Progress Notes (Signed)
NAME: Jacob Love  DOB: October 01, 1981  MRN: [id number]  Date/Time: 10/29/2022 10:13 AM   ?follow up for rt foot infection- here after discharge Jacob Love is a 42 y.o. 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 He has had a callus/wound for more than a year and as it was getting worse he underwent first ray parital ampoutation on 08/26/22 and biopsy of 2nd toe- both showed acute osteomyelitis . Culture was reported as multiple organisms- HE was discharged on PO bactrim and Augmentin HE underwent further surgery with I/d and debridement of the rt met on 10/03/22 and it wasreported as chronic osteo.culture was positive for corynebacterium striatum HE was discharged home on IV vanco and ceftriaxone to complete 4 weeks on 11/01/22 HE is 100% adherent to IV antibiotics- HE has gone back to work Had sutures removed by Dr.Clinie last week and he was happy with his progress Pt has no specific complaints    ID   Past Medical History:  Diagnosis Date   Hypertension    Meniere disease    Morbid obesity ([place])     Past Surgical History:  Procedure Laterality Date   AMPUTATION Right 08/26/2022   Procedure: RIGHT PARTIAL 1ST RAY AMPUTATION;  Surgeon: Caroline More, DPM;  Location: [facility] OR[facility];  Service: Podiatry;  Laterality: Right;   INCISION AND DRAINAGE OF WOUND Right 10/03/2022   Procedure: IRRIGATION AND DEBRIDEMENT SOFT TISSUE AND BONE RIGHT FOOT;  Surgeon: Sharlotte Alamo, DPM;  Location: [facility];  Service: Podiatry;  Laterality: Right;   NO PAST SURGERIES      Social History   Socioeconomic History   Marital status: Divorced    Spouse name: Not on file   Number of children: 1   Years of education: Not on file   Highest education level: Not on file  Occupational History   Not on file  Tobacco Use   Smoking status: Every Day    Packs/day: 1.00     Types: Cigarettes   Smokeless tobacco: Current    Types: Snuff  Vaping Use   Vaping Use: Former  Substance and Sexual Activity   Alcohol use: Yes   Drug use: Never   Sexual activity: Not on file  Other Topics Concern   Not on file  Social History Narrative   Not on file   Social Determinants of Health   Financial Resource Strain: Not on file  Food Insecurity: Food Insecurity Present (10/02/2022)   Hunger Vital Sign    Worried About [place] in the Last Year: Sometimes true    Ran Out of Food in the Last Year: Sometimes true  Transportation Needs: No Transportation Needs (10/02/2022)   PRAPARE - Hydrologist (Medical): No    Lack of Transportation (Non-Medical): No  Physical Activity: Not on file  Stress: Not on file  Social Connections: Not on file  Intimate Partner Violence: Not At Risk (10/02/2022)   Humiliation, Afraid, Rape, and Kick questionnaire    Fear of Current or Ex-Partner: No    Emotionally Abused: No    Physically Abused: No    Sexually Abused: No    No family history on file. No Known Allergies I? Current Outpatient Medications  Medication Sig Dispense Refill   aspirin EC 81 MG tablet Take 81 mg by mouth daily.  cefTRIAXone (ROCEPHIN) IVPB Inject 2 g into the vein daily for 23 days. Indication: Foot infection with chronic osteomyelitis First Dose: Yes Last Day of Therapy:  11/01/2022 Labs - Every Monday:  CBC/D, CMP, and vancomycin trough. Please pull PIC at completion of IV antibiotics Fax weekly lab results  promptly to [phone] Method of administration: IV Push Method of administration may be changed at the discretion of home infusion pharmacist based upon assessment of the patient and/or caregiver's ability to self-administer the medication ordered. 24 Units 0   hydrochlorothiazide (HYDRODIURIL) 25 MG tablet Take 25 mg by mouth daily.     HYDROcodone-acetaminophen (NORCO/VICODIN) 5-325 MG tablet Take  1-2 tablets by mouth every 6 (six) hours as needed for severe pain. 30 tablet 0   ketoconazole (NIZORAL) 2 % cream Apply topically.     Multiple Vitamin (MULTIVITAMIN WITH MINERALS) TABS tablet Take 1 tablet by mouth daily. 30 tablet 0   nicotine (NICODERM CQ - DOSED IN MG/24 HR) 7 mg/24hr patch Place 7 mg onto the skin daily.     vancomycin IVPB Inject 2,000 mg into the vein every 12 (twelve) hours for 24 days. Indication:  foot infection with chronic osteomyelitis First Dose: Yes Last Day of Therapy:  11/01/2022 Labs - Every Monday:  CBC/D, CMP, and vancomycin trough. Please pull PIC at completion of IV antibiotics Fax weekly lab results  promptly to [phone] Method of administration:Elastomeric Method of administration may be changed at the discretion of the patient and/or caregiver's ability to self-administer the medication ordered. 48 Units 0   No current facility-administered medications for this visit.     Abtx:  Anti-infectives (From admission, onward)    None       REVIEW OF SYSTEMS:  Const: negative fever, negative chills, negative weight loss Eyes: negative diplopia or visual changes, negative eye pain ENT: negative coryza, negative sore throat Resp: negative cough, hemoptysis, dyspnea Cards: negative for chest pain, palpitations, lower extremity edema GU: negative for frequency, dysuria and hematuria GI: Negative for abdominal pain, diarrhea, bleeding, constipation Skin: negative for rash and pruritus Heme: negative for easy bruising and gum/nose bleeding MS: negative for myalgias, arthralgias, back pain and muscle weakness Neurolo:negative for headaches, dizziness, vertigo, memory problems  Psych: negative for feelings of anxiety, depression  Endocrine: negative for thyroid, diabetes Allergy/Immunology- negative for any medication or food allergies ? Pertinent Positives include : Objective:  VITALS:  BP (!) 148/88   Pulse 86   Temp (!) 96.3 F (35.7 C)  (Temporal)   LDA Rt PICC  PHYSICAL EXAM:  General: Alert, cooperative, no distress, appears stated age.  Head: Normocephalic, without obvious abnormality, atraumatic. Eyes: Conjunctivae clear, anicteric sclerae. Pupils are equal ENT Nares normal. No drainage or sinus tenderness. Lips, mucosa, and tongue normal. No Thrush Neck: Supple, symmetrical, no adenopathy, thyroid: non tender no carotid bruit and no JVD. Back: No CVA tenderness. Lungs: Clear to auscultation bilaterally. No Wheezing or Rhonchi. No rales. Heart: Regular rate and rhythm, no murmur, rub or gallop. Abdomen: Soft, non-tender,not distended. Bowel sounds normal. No masses Extremities: rt foot surgical site looks okay except for one area of maceration from the melting antibiotic beads  Skin: No rashes or lesions. Or bruising Lymph: Cervical, supraclavicular normal. Neurologic: Grossly non-focal Pertinent Labs Lab Results    Latest Ref Rng & Units 10/27/2022    8:29 AM 10/20/2022    8:30 AM 10/13/2022    8:29 AM  CBC  WBC 4.0 - 10.5 K/uL 9.3  9.4  11.9   Hemoglobin 13.0 - 17.0 g/dL 14.2  13.4  13.8   Hematocrit 39.0 - 52.0 % 44.4  42.1  43.5   Platelets 150 - 400 K/uL 258  262  305        Latest Ref Rng & Units 10/27/2022    8:29 AM 10/20/2022    8:30 AM 10/13/2022    8:29 AM  CMP  Glucose 70 - 99 mg/dL 144  133  149   BUN 6 - 20 mg/dL _0 Creatinine 0.61 - 1.24 mg/dL 0.75  0.71  0.75   Sodium 135 - 145 mmol/L 135  138  137   Potassium 3.5 - 5.1 mmol/L 3.4  3.6  3.1   Chloride 98 - 111 mmol/L 102  105  103   CO2 22 - 32 mmol/L _1 Calcium 8.9 - 10.3 mg/dL 9.0  9.0  9.0   Total Protein 6.5 - 8.1 g/dL 7.3  7.2  7.4   Total Bilirubin 0.3 - 1.2 mg/dL 0.9  0.7  1.0   Alkaline Phos 38 - 126 U/L 122  90  104   AST 15 - 41 U/L _2 ALT 0 - 44 U/L _3 ? Impression/Recommendation ? 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

## 2022-10-30 ENCOUNTER — Other Ambulatory Visit: Payer: Self-pay

## 2022-11-02 ENCOUNTER — Ambulatory Visit
Admission: RE | Admit: 2022-11-02 | Discharge: 2022-11-02 | Disposition: A | Discharge status: Home / Self Care | Payer: Self-pay | Source: Ambulatory Visit | Attending: Infectious Diseases | Admitting: Infectious Diseases

## 2022-11-02 NOTE — Progress Notes (Signed)
Picc Line removed as ordered today 11/02/2022. VSS. No signs of acute distress.

## 2022-11-04 ENCOUNTER — Other Ambulatory Visit: Payer: Self-pay

## 2022-11-05 ENCOUNTER — Other Ambulatory Visit: Payer: Self-pay

## 2022-12-24 DIAGNOSIS — Z20822 Contact with and (suspected) exposure to covid-19: Secondary | ICD-10-CM | POA: Diagnosis not present

## 2022-12-24 DIAGNOSIS — R0981 Nasal congestion: Secondary | ICD-10-CM | POA: Diagnosis not present

## 2023-02-05 DIAGNOSIS — H5213 Myopia, bilateral: Secondary | ICD-10-CM | POA: Diagnosis not present
